# Patient Record
Sex: Female | Born: 1980 | Race: White | Hispanic: No | Smoking: Current every day smoker
Health system: Southern US, Community
[De-identification: ages and names within clinical notes are randomized; demographics above are authoritative.]

## PROBLEM LIST (undated history)

## (undated) ENCOUNTER — Ambulatory Visit: Admission: EM | Payer: 59 | Source: Home / Self Care

## (undated) DIAGNOSIS — F329 Major depressive disorder, single episode, unspecified: Secondary | ICD-10-CM

## (undated) DIAGNOSIS — F419 Anxiety disorder, unspecified: Secondary | ICD-10-CM

## (undated) DIAGNOSIS — D649 Anemia, unspecified: Secondary | ICD-10-CM

## (undated) DIAGNOSIS — E079 Disorder of thyroid, unspecified: Secondary | ICD-10-CM

## (undated) DIAGNOSIS — G43909 Migraine, unspecified, not intractable, without status migrainosus: Secondary | ICD-10-CM

## (undated) DIAGNOSIS — F32A Depression, unspecified: Secondary | ICD-10-CM

## (undated) DIAGNOSIS — J45909 Unspecified asthma, uncomplicated: Secondary | ICD-10-CM

## (undated) DIAGNOSIS — T7840XA Allergy, unspecified, initial encounter: Secondary | ICD-10-CM

## (undated) DIAGNOSIS — E119 Type 2 diabetes mellitus without complications: Secondary | ICD-10-CM

## (undated) HISTORY — PX: LAPAROSCOPY ABDOMEN DIAGNOSTIC: PRO50

## (undated) HISTORY — DX: Disorder of thyroid, unspecified: E07.9

## (undated) HISTORY — DX: Type 2 diabetes mellitus without complications: E11.9

## (undated) HISTORY — DX: Unspecified asthma, uncomplicated: J45.909

## (undated) HISTORY — DX: Anemia, unspecified: D64.9

## (undated) HISTORY — PX: TONSILLECTOMY: SUR1361

## (undated) HISTORY — DX: Anxiety disorder, unspecified: F41.9

## (undated) HISTORY — DX: Depression, unspecified: F32.A

## (undated) HISTORY — PX: ABDOMINAL HYSTERECTOMY: SHX81

## (undated) HISTORY — DX: Allergy, unspecified, initial encounter: T78.40XA

## (undated) HISTORY — DX: Major depressive disorder, single episode, unspecified: F32.9

---
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
## 2005-05-14 DIAGNOSIS — E039 Hypothyroidism, unspecified: Secondary | ICD-10-CM | POA: Insufficient documentation

## 2006-12-21 ENCOUNTER — Emergency Department (HOSPITAL_COMMUNITY): Admission: EM | Admit: 2006-12-21 | Discharge: 2006-12-21 | Payer: Self-pay | Admitting: Emergency Medicine

## 2007-05-15 DIAGNOSIS — E109 Type 1 diabetes mellitus without complications: Secondary | ICD-10-CM | POA: Insufficient documentation

## 2007-09-12 ENCOUNTER — Emergency Department (HOSPITAL_COMMUNITY): Admission: EM | Admit: 2007-09-12 | Discharge: 2007-09-12 | Payer: Self-pay | Admitting: Emergency Medicine

## 2007-09-12 IMAGING — CR DG FOOT COMPLETE 3+V*R*
3 series · 3 of 3 positions shown · non-contrast
Comparison: None

CLINICAL DATA: *Pain.

RIGHT FOOT COMPLETE - 3+ VIEW

[view not recorded (1 of 3)]
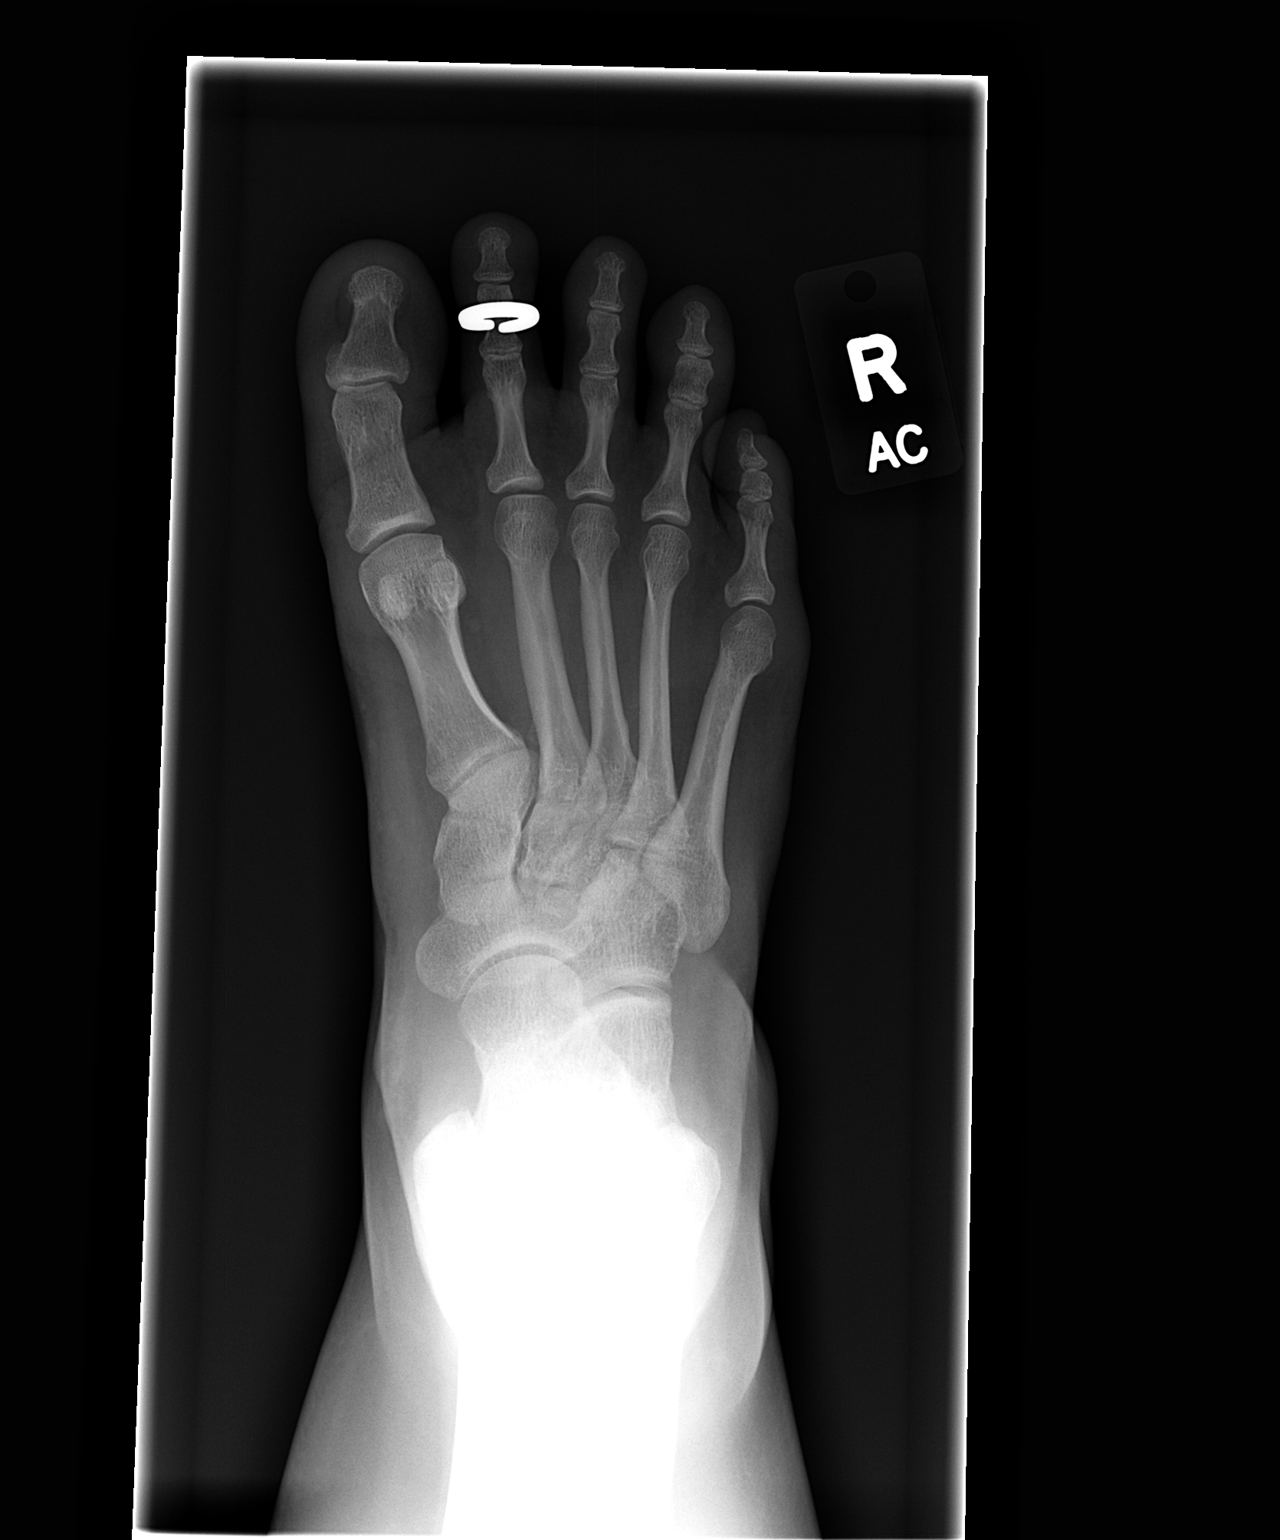

[view not recorded (2 of 3)]
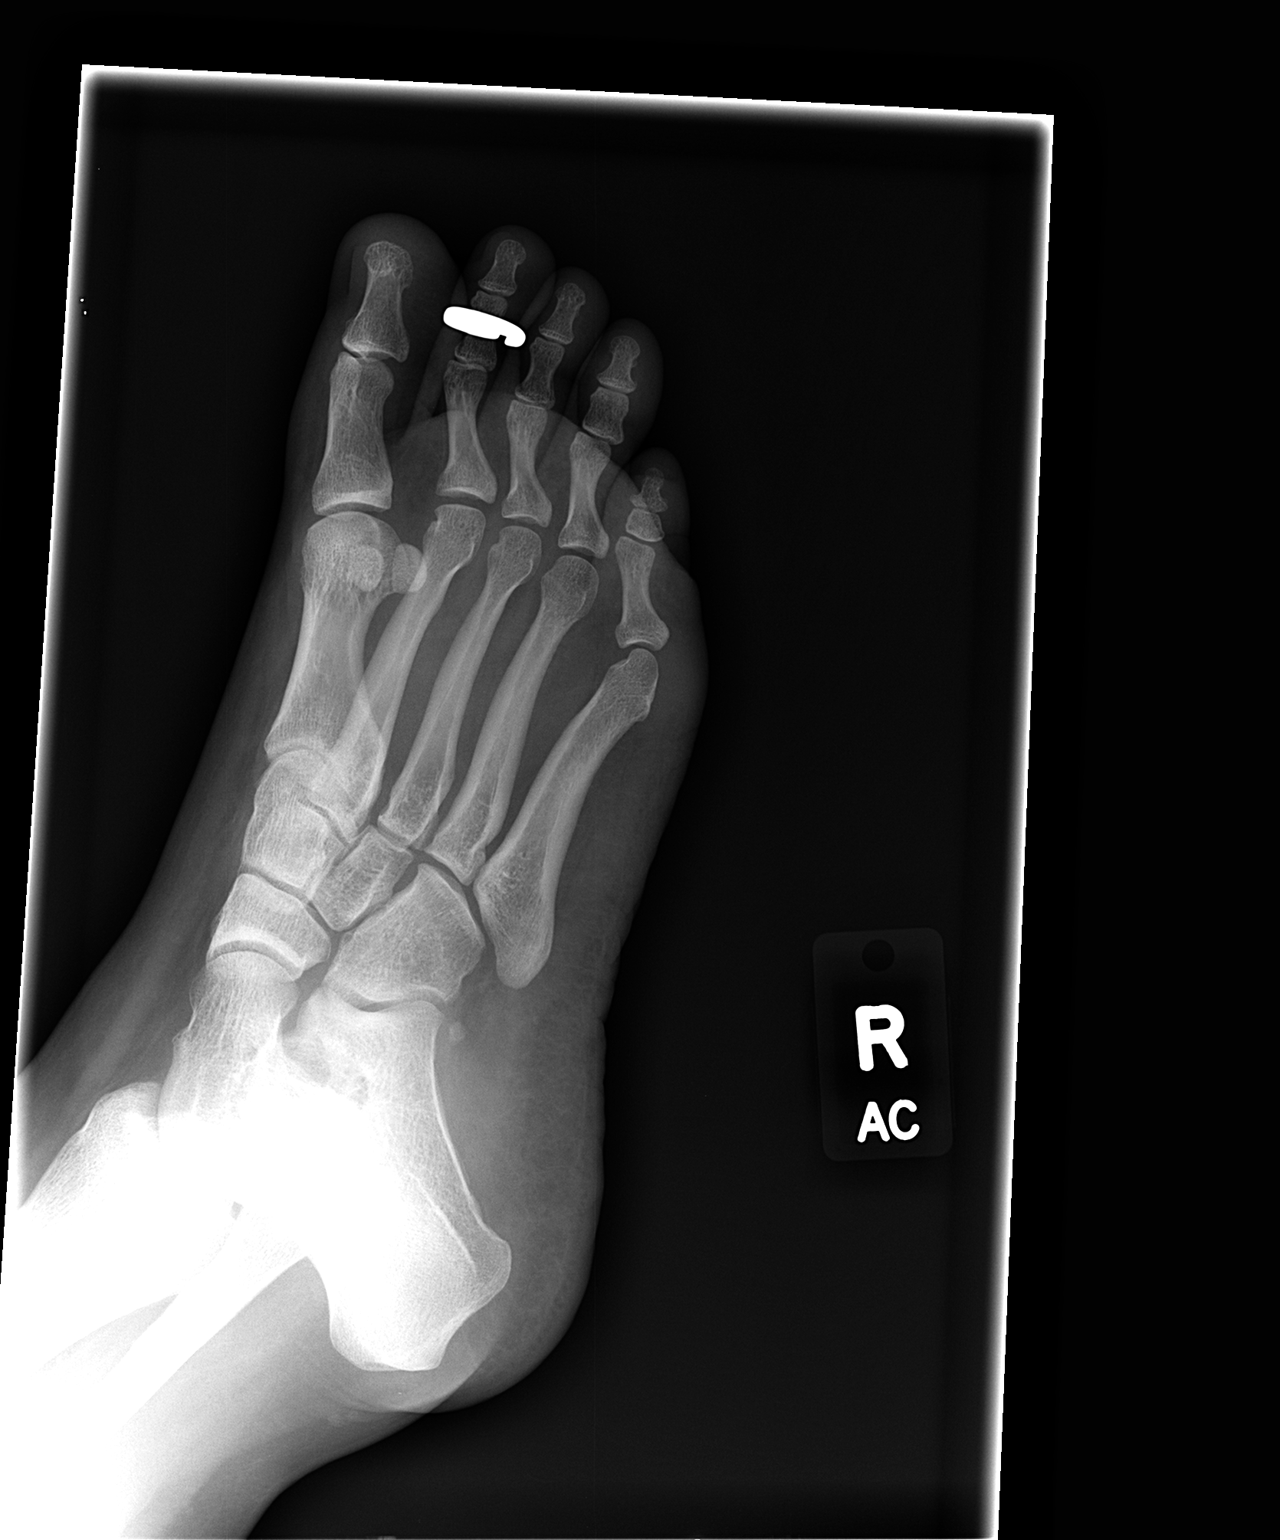

[view not recorded (3 of 3)]
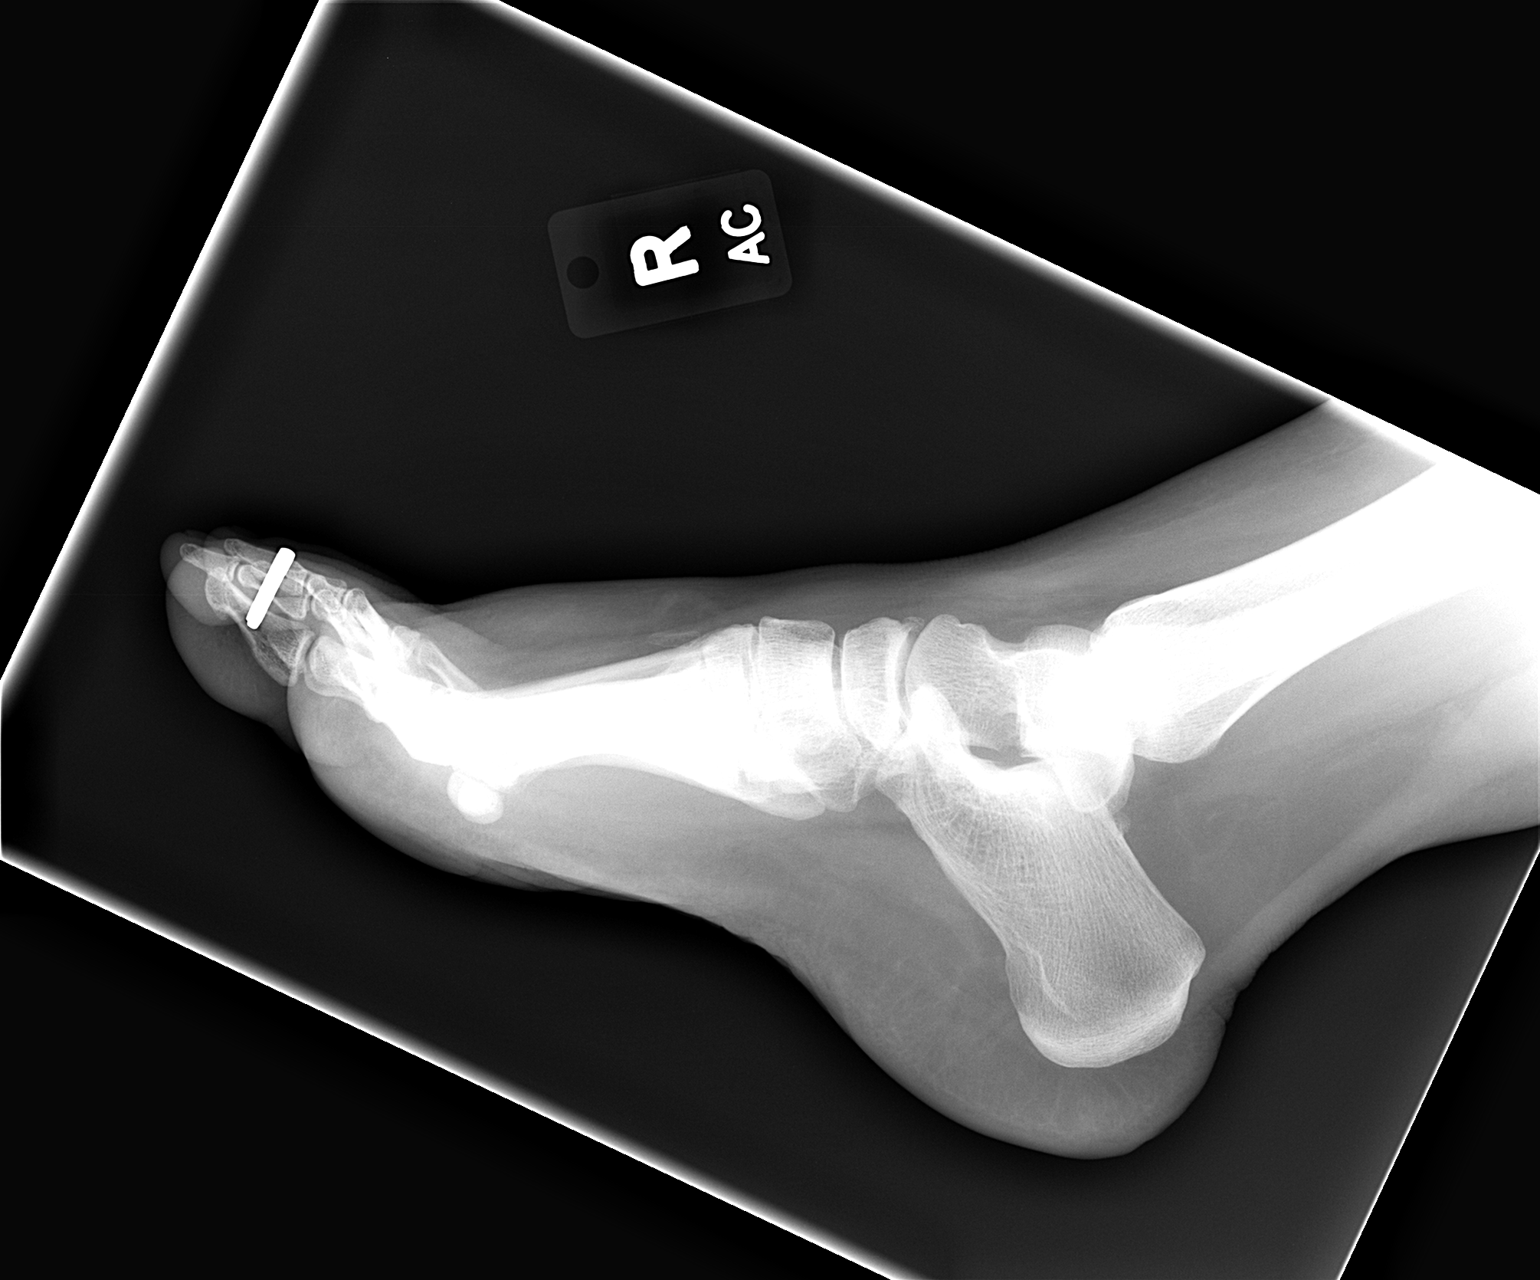

[3 of 3 positions shown; findings below may reference images not displayed]

FINDINGS: A small bony density is present adjacent to the dorsal
aspect of the navicular, seen on the lateral view only.
Nondisplaced or minimal avulsion fracture is not excluded.  Soft
tissue swelling over the dorsum of the foot is present.  Otherwise
no fractures or dislocations are seen.
IMPRESSION: Small avulsion fracture at the dorsal aspect of the navicular of
indeterminate age is suggested.

## 2007-09-15 ENCOUNTER — Emergency Department (HOSPITAL_COMMUNITY): Admission: EM | Admit: 2007-09-15 | Discharge: 2007-09-15 | Payer: Self-pay | Admitting: Emergency Medicine

## 2008-07-15 ENCOUNTER — Emergency Department (HOSPITAL_COMMUNITY): Admission: EM | Admit: 2008-07-15 | Discharge: 2008-07-15 | Payer: Self-pay | Admitting: Emergency Medicine

## 2008-09-01 ENCOUNTER — Emergency Department (HOSPITAL_COMMUNITY): Admission: EM | Admit: 2008-09-01 | Discharge: 2008-09-01 | Payer: Self-pay | Admitting: Emergency Medicine

## 2009-02-03 ENCOUNTER — Emergency Department (HOSPITAL_COMMUNITY): Admission: EM | Admit: 2009-02-03 | Discharge: 2009-02-03 | Payer: Self-pay | Admitting: Emergency Medicine

## 2010-03-20 ENCOUNTER — Emergency Department (HOSPITAL_COMMUNITY): Admission: EM | Admit: 2010-03-20 | Discharge: 2010-03-20 | Payer: Self-pay | Admitting: Emergency Medicine

## 2010-08-18 LAB — URINALYSIS, ROUTINE W REFLEX MICROSCOPIC
Glucose, UA: NEGATIVE mg/dL
Hgb urine dipstick: NEGATIVE
Specific Gravity, Urine: 1.028 (ref 1.005–1.030)
Urobilinogen, UA: 0.2 mg/dL (ref 0.0–1.0)
pH: 5 (ref 5.0–8.0)

## 2010-08-24 LAB — GLUCOSE, CAPILLARY: Glucose-Capillary: 100 mg/dL — ABNORMAL HIGH (ref 70–99)

## 2011-02-01 DIAGNOSIS — J302 Other seasonal allergic rhinitis: Secondary | ICD-10-CM | POA: Insufficient documentation

## 2011-02-01 DIAGNOSIS — F32A Depression, unspecified: Secondary | ICD-10-CM | POA: Insufficient documentation

## 2011-02-01 DIAGNOSIS — N809 Endometriosis, unspecified: Secondary | ICD-10-CM | POA: Insufficient documentation

## 2011-02-01 DIAGNOSIS — F419 Anxiety disorder, unspecified: Secondary | ICD-10-CM | POA: Insufficient documentation

## 2011-02-01 DIAGNOSIS — G4700 Insomnia, unspecified: Secondary | ICD-10-CM | POA: Insufficient documentation

## 2011-02-01 DIAGNOSIS — F411 Generalized anxiety disorder: Secondary | ICD-10-CM | POA: Insufficient documentation

## 2012-06-04 ENCOUNTER — Ambulatory Visit (INDEPENDENT_AMBULATORY_CARE_PROVIDER_SITE_OTHER): Payer: 59 | Admitting: Family Medicine

## 2012-06-04 VITALS — BP 114/81 | HR 71 | Temp 98.4°F | Resp 18 | Ht 62.0 in | Wt 192.0 lb

## 2012-06-04 DIAGNOSIS — M62838 Other muscle spasm: Secondary | ICD-10-CM

## 2012-06-04 DIAGNOSIS — S239XXA Sprain of unspecified parts of thorax, initial encounter: Secondary | ICD-10-CM | POA: Insufficient documentation

## 2012-06-04 MED ORDER — MELOXICAM 15 MG PO TABS: ORAL_TABLET | ORAL | Status: DC

## 2012-06-04 MED ORDER — TRAMADOL HCL 50 MG PO TABS: 50.0000 mg | ORAL_TABLET | Freq: Three times a day (TID) | ORAL | Status: DC | PRN

## 2012-06-04 MED ORDER — KETOROLAC TROMETHAMINE 60 MG/2ML IM SOLN
60.0000 mg | Freq: Once | INTRAMUSCULAR | Status: AC
  Administered 2012-06-04: 60 mg via INTRAMUSCULAR

## 2012-06-04 NOTE — Assessment & Plan Note (Signed)
Patient has thoracic back pain of one-day duration after fall. Patient has no pinpoint tenderness, no spinous process tenderness and no radiculopathy symptoms imaging is warranted. Patient was given an injection of 60 mg up toward all which did get some improvement after 15 minutes. In addition to that patient was given a prescription for meloxicam which she will take daily for one week and then as needed thereafter. Patient was also given tramadol to help at night. Patient was also given a home exercise program to increase range of motion. Patient is to return in one week if not better and then we will consider further imaging.

## 2012-06-04 NOTE — Patient Instructions (Signed)
Very nice to meet you. I think you do have a low back muscle spasm. I'm giving you a medicine called meloxicam. Take it daily for the next week then as needed thereafter. Only stop it if it hurts her stomach. I'm also giving you a medicine called tramadol. This medicine can make you sleepy. If for some reason over the course of the next week you do not improve please come back and we'll consider doing x-rays.

## 2012-06-04 NOTE — Progress Notes (Signed)
Chief complaint: Back pain  History of present illness: This is a 32 year old female with no significant past medical history back pain coming in with acute exacerbation after a fall one day ago. Patient states that she was walking down stairs and attempted to catch herself and she lost her balance with both arms and fell directly on her back. Patient states that her pain is mostly in the thoracic region. Patient states that it might be more right greater than left. Patient states that she did have an injury when she was 33 years old in the similar area. Patient denies any surgical procedures that were necessary though. Patient denies any radiation of pain, describes the pain as a dull constant ache, denies any numbness or weakness in the upper extremities. Patient denies any fever, shortness of breath, any weakness in the lower chewing, or any bowel or bladder incontinence.  Past Medical History  Diagnosis Date  . Allergy   . Anemia   . Depression   . Diabetes mellitus without complication   . Asthma   . Thyroid disease   . Anxiety    Past Surgical History  Procedure Date  . Abdominal hysterectomy   . Laparoscopy abdomen diagnostic   . Tonsillectomy    History  Substance Use Topics  . Smoking status: Current Every Day Smoker -- 1.0 packs/day for 20 years    Types: Cigarettes  . Smokeless tobacco: Not on file  . Alcohol Use: No   History reviewed. No pertinent family history.  Physical exam Blood pressure 114/81, pulse 71, temperature 98.4 F (36.9 C), temperature source Oral, resp. rate 18, height 5\' 2"  (1.575 m), weight 192 lb (87.091 kg), SpO2 98.00%. General: No apparent distress alert and oriented x3 moderately obese female. Cardiovascular: Regular rate and rhythm no murmur appreciated Pulmonary: Clear to auscultation bilaterally Back exam: On inspection patient does not have any gross deformity. Patient does have a back tatoo of the lower spine.  Patient has minimal tenderness  of the thoracic region mostly on the right side. Patient has no spinous process tenderness. Patient has 5 out of 5 strength of all extremities with good capillary refill in neurovascularly intact in all extremities. Patient has a negative straight leg test as well as a negative Faber's test bilaterally.Marland Kitchen

## 2012-06-05 ENCOUNTER — Telehealth: Payer: Self-pay

## 2012-06-05 MED ORDER — CYCLOBENZAPRINE HCL 10 MG PO TABS: 10.0000 mg | ORAL_TABLET | Freq: Three times a day (TID) | ORAL | Status: DC | PRN

## 2012-06-05 NOTE — Telephone Encounter (Signed)
I have sent flexeril to the pharmacy to help with muscle spasm.  I would encourage pt to use OTC medications - may alternate Tylenol and Advil every 4 hours.  Hesitant to send in another medication without seeing her as she has apparently had a reaction to mobic and ultram.  If she is in severe pain, she needs to return or go to the ED.

## 2012-06-05 NOTE — Telephone Encounter (Signed)
Patient states she is having itching from meds Tramadol, and Meloxicam given last pm I have advised her to d/c both of these meds, and I will see if we can send in something else [facility] uses [facility]

## 2012-06-05 NOTE — Telephone Encounter (Signed)
How is her pain? Could she take OTC tylenol or ibuprofen, or does she need something stronger?

## 2012-06-05 NOTE — Telephone Encounter (Signed)
She reports no relief with OTC Meds. Please advise.

## 2012-06-05 NOTE — Telephone Encounter (Signed)
Pt is calling again, she is in a lot of pain

## 2012-06-05 NOTE — Telephone Encounter (Signed)
Patient is having an allergic reaction to medication prescribed last night.  ITCHING Severely   Would like something else     CBN:  [phone]

## 2012-06-06 NOTE — Telephone Encounter (Signed)
Spoke to patient to advise. She is advised the flexeril may make her drowsy.

## 2012-06-06 NOTE — Telephone Encounter (Signed)
LMOM to CB. 

## 2012-06-10 ENCOUNTER — Ambulatory Visit (INDEPENDENT_AMBULATORY_CARE_PROVIDER_SITE_OTHER): Payer: 59 | Admitting: Family Medicine

## 2012-06-10 ENCOUNTER — Ambulatory Visit: Payer: 59

## 2012-06-10 VITALS — BP 115/79 | HR 77 | Temp 98.1°F | Resp 16 | Ht 62.5 in | Wt 196.4 lb

## 2012-06-10 DIAGNOSIS — M419 Scoliosis, unspecified: Secondary | ICD-10-CM

## 2012-06-10 DIAGNOSIS — M549 Dorsalgia, unspecified: Secondary | ICD-10-CM

## 2012-06-10 DIAGNOSIS — M62830 Muscle spasm of back: Secondary | ICD-10-CM

## 2012-06-10 DIAGNOSIS — S29019A Strain of muscle and tendon of unspecified wall of thorax, initial encounter: Secondary | ICD-10-CM

## 2012-06-10 IMAGING — CR DG THORACIC SPINE 2V
2 series · 2 of 2 positions shown · non-contrast
Comparison: None.

CLINICAL DATA: History of thoracic back pain.

THORACIC SPINE - 2 VIEW

[AP]
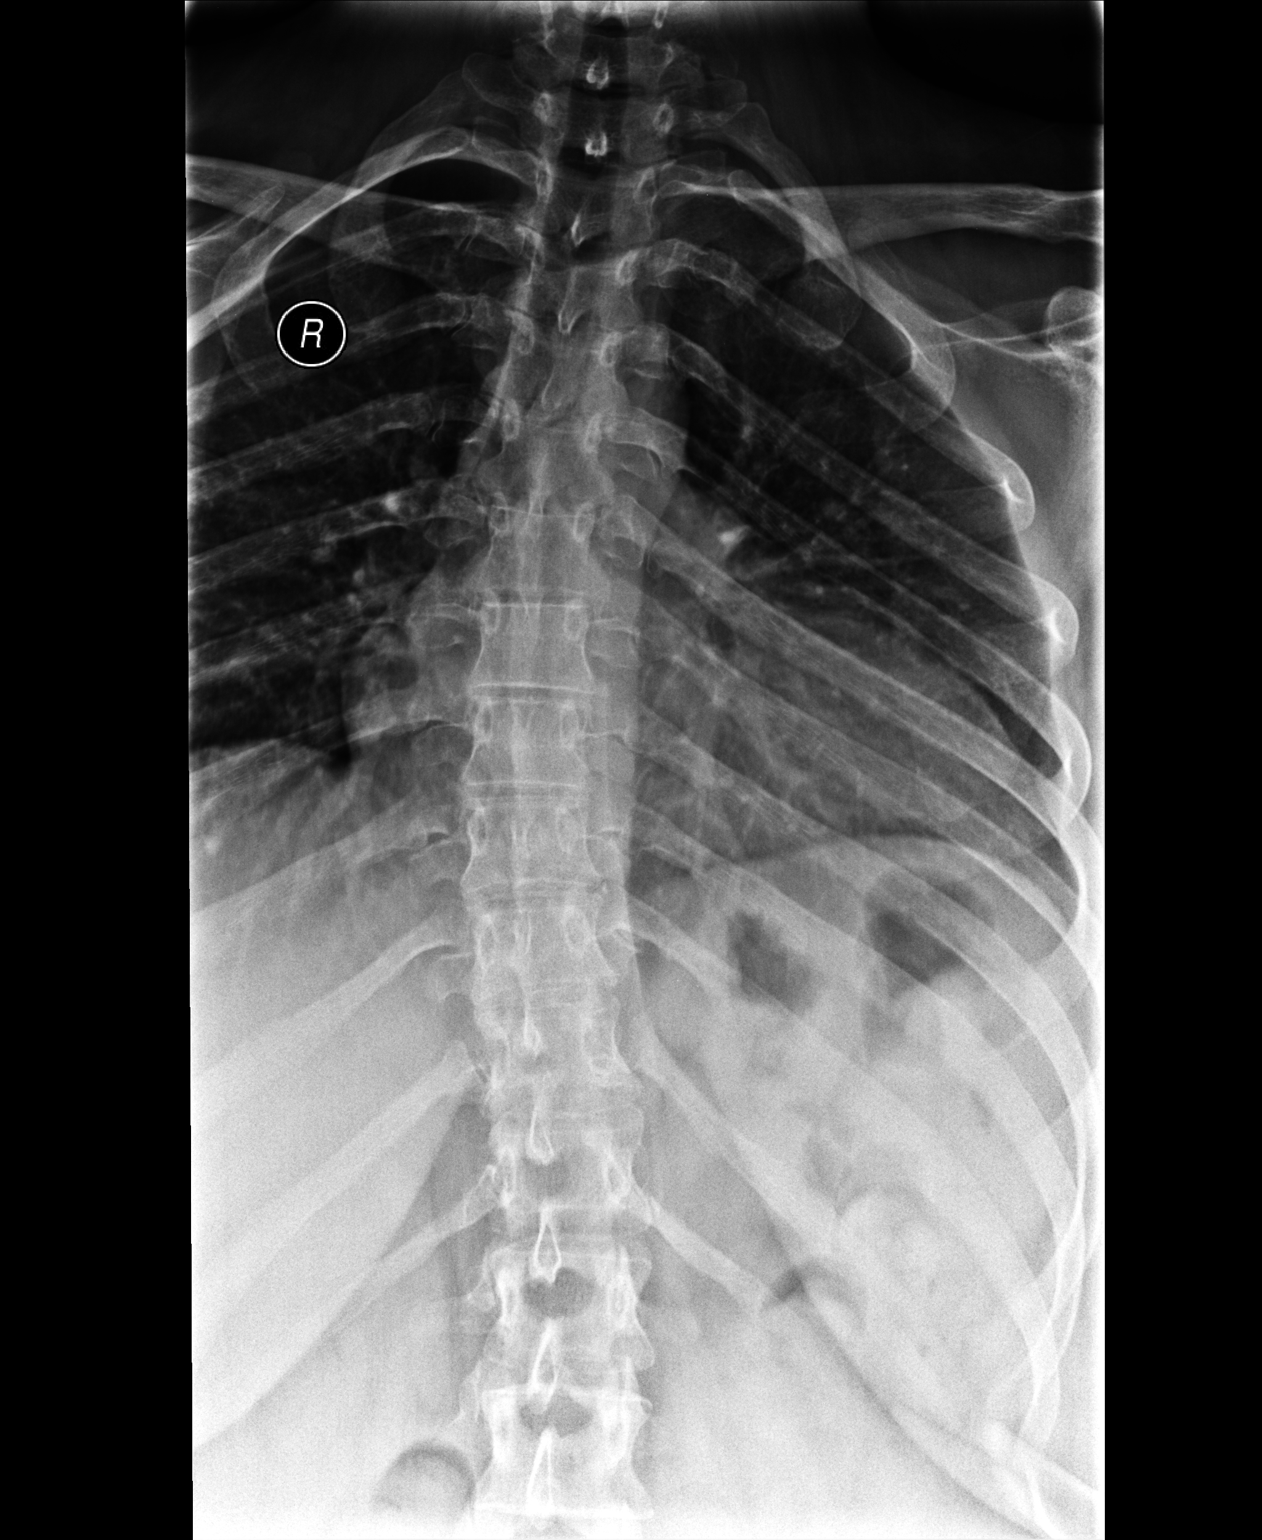

[lateral]
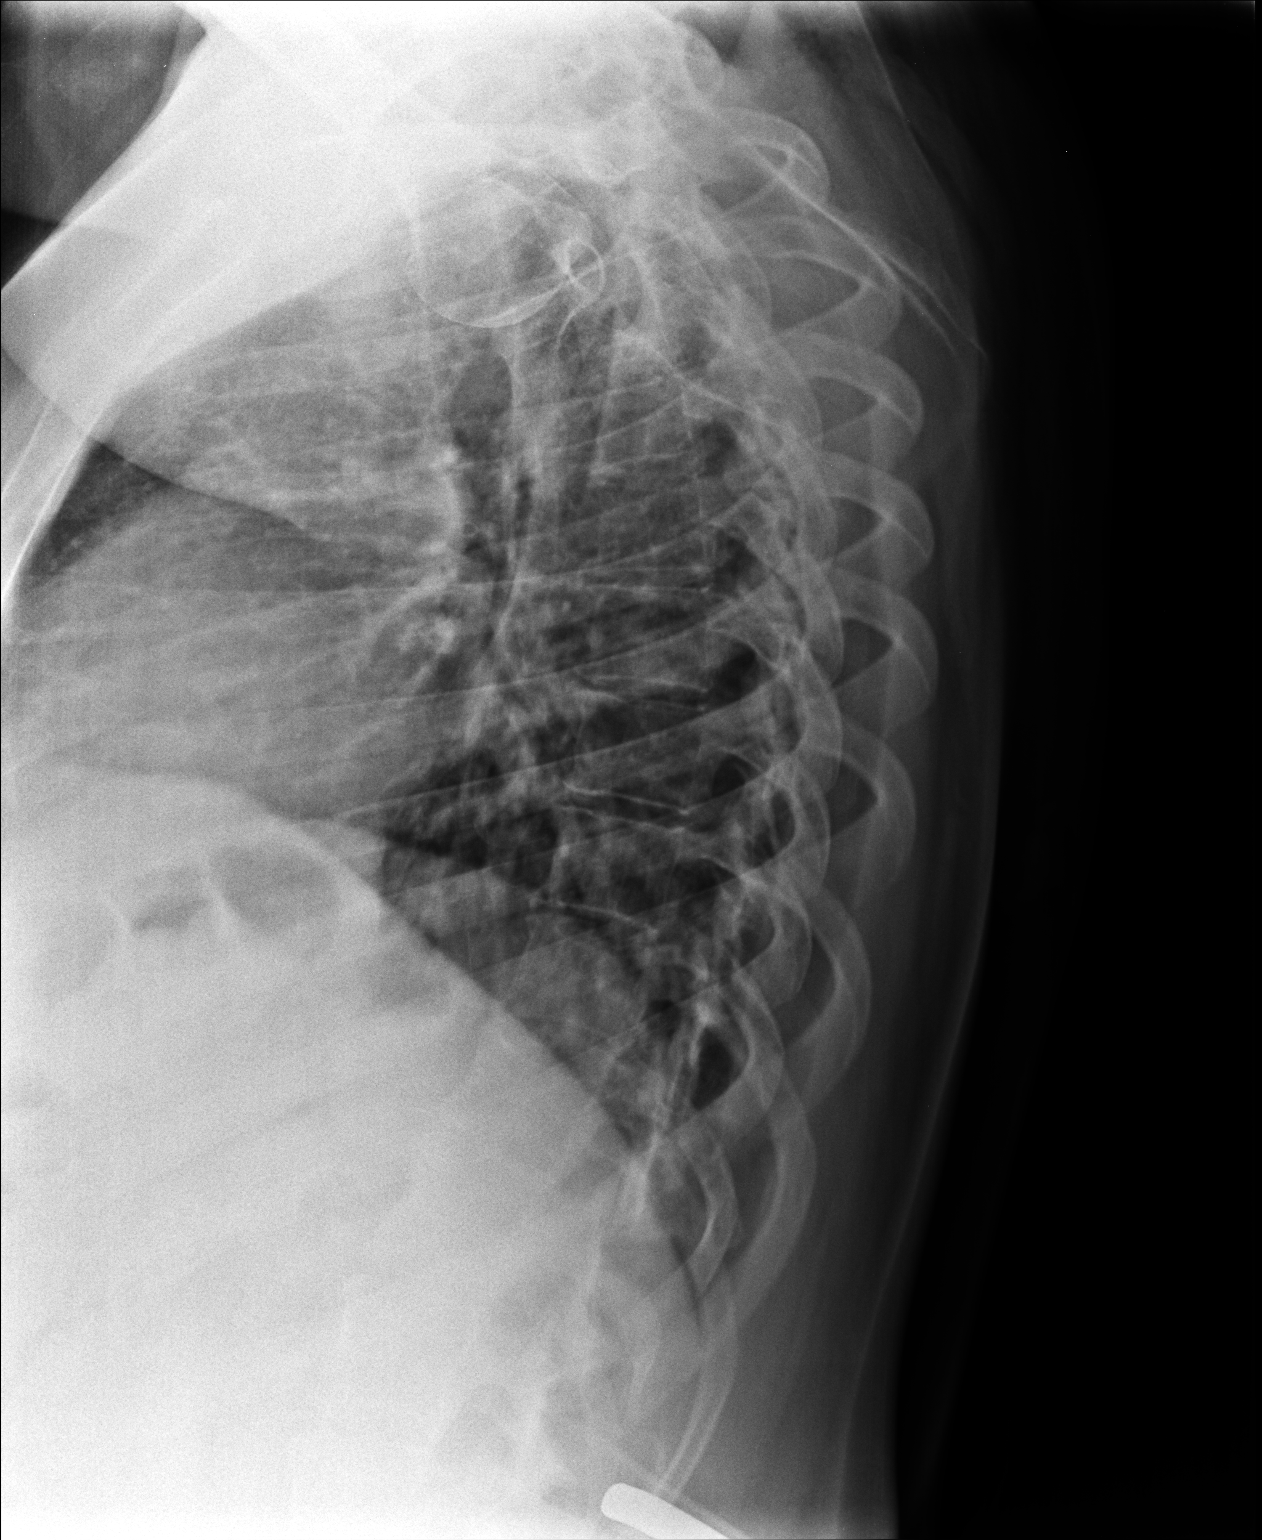

[2 of 2 positions shown; findings below may reference images not displayed]

FINDINGS: There is moderate thoracic scoliosis convexity to the
right.  No fracture or bony destruction is identified.  No
significant spondylosis is evident.
IMPRESSION: Scoliosis.

## 2012-06-10 MED ORDER — HYDROCODONE-ACETAMINOPHEN 5-325 MG PO TABS: 1.0000 | ORAL_TABLET | Freq: Every evening | ORAL | Status: DC | PRN

## 2012-06-10 NOTE — Progress Notes (Signed)
Subjective:    Patient ID: Denise Collins, female    DOB: [birth year], 32 y.o.   MRN: [id number]  HPI  Pt fell down a little a wk ago down 3 stairs and pain won't go away. 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  She has been using mobic once a day and flexeril tid but it is no longer touching it.  The flexeril doesn't help, just makes her drowsy. Hurts more when she lifts her arms but no weakness or numbness in arms and legs.  No changes in bowels or bladder. Tramadol make her itch No f/c. Past Medical History  Diagnosis Date  . Allergy   . Anemia   . Depression   . Diabetes mellitus without complication   . Asthma   . Thyroid disease   . Anxiety    Current Outpatient Prescriptions on File Prior to Visit  Medication Sig Dispense Refill  . meloxicam (MOBIC) 15 MG tablet 1 pill daily for 1 week then as needed.  30 tablet  0   Allergies  Allergen Reactions  . Imitrex (Sumatriptan)     Worsens headache  . Triptans     Worsens headache     Review of Systems  Constitutional: Negative for fever and chills.  Gastrointestinal: Negative for nausea, vomiting, abdominal pain, diarrhea and constipation.  Genitourinary: Negative for urgency, frequency, decreased urine volume and difficulty urinating.  Musculoskeletal: Positive for myalgias and back pain. Negative for joint swelling, arthralgias and gait problem.  Neurological: Negative for weakness and numbness.  Hematological: Negative for adenopathy. Does not bruise/bleed easily.  Psychiatric/Behavioral: Positive for sleep disturbance.      BP 115/79  Pulse 77  Temp 98.1 F (36.7 C) (Oral)  Resp 16  Ht 5' 2.5" (1.588 m)  Wt 196 lb 6.4 oz (89.086 kg)  BMI 35.35 kg/m2 Objective:   Physical Exam  Constitutional: She is oriented to person, place, and time. She appears well-developed and well-nourished.  HENT:  Head: Normocephalic.  Eyes: Conjunctivae  normal are normal. No scleral icterus.  Neck: Normal range of motion. Neck supple.  Cardiovascular: Normal rate, regular rhythm and normal heart sounds.   Pulmonary/Chest: Effort normal and breath sounds normal. No respiratory distress.  Musculoskeletal: She exhibits no edema.       Right hip: Normal.       Left hip: Normal.       Cervical back: Normal.       Thoracic back: She exhibits decreased range of motion, tenderness, bony tenderness, pain and spasm. She exhibits no edema, no deformity and no laceration.       Lumbar back: She exhibits decreased range of motion and spasm. She exhibits no tenderness, no bony tenderness and no deformity.  Neurological: She is alert and oriented to person, place, and time. She has normal strength and normal reflexes. No sensory deficit. She exhibits normal muscle tone. Coordination and gait normal.  Reflex Scores:      Patellar reflexes are 2+ on the right side and 2+ on the left side.      Achilles reflexes are 2+ on the right side and 2+ on the left side. Skin: Skin is warm and dry. No erythema.  Psychiatric: She has a normal mood and affect. Her behavior is normal.      [facility] reading (PRIMARY) by  Dr. Clelia Croft. Mild thoracic scoliosis. No acute abnormality.  Assessment &  Plan:   1. Back pain  DG Thoracic Spine 2 View  2. Scoliosis of thoracic spine  Ambulatory referral to Physical Therapy  3. Muscle spasm of back  Ambulatory referral to Physical Therapy  4. Strain of thoracic region     Meds ordered this encounter  Medications  . HYDROcodone-acetaminophen (NORCO/VICODIN) 5-325 MG per tablet    Sig: Take 1 tablet by mouth at bedtime as needed for pain.    Dispense:  12 tablet    Refill:  0   D/c flexeril and tramadol. Cont mobic. Freq heat qid with gentle stretching.

## 2012-06-10 NOTE — Patient Instructions (Addendum)
Scoliosis Scoliosis is the name given to a spine that curves sideways. It is a common condition found in up to ten percent of adolescents. It is more common in teenage girls. This is sometimes the result of other underlying problems such as unequal leg length or muscular problems. Approximately 70% of the time the cause unknown. It can cause twisting of the shoulders, hips, chest, back, and rib cage. Exercises generally do not affect the course of this disease, but may be helpful in strengthening weak muscle groups. Orthopedic braces may be needed during growth spurts. Surgery may be necessary for progressive cases. HOME CARE INSTRUCTIONS   Your caregiver may suggest exercises to strengthen your muscles. Follow their instructions. Ask your caregiver if you can participate in sports activities.  Bracing may be needed to try to limit the progression of the spinal curve. Wear the brace as instructed by your caregiver.  Follow-up appointments are important. Often mild cases of scoliosis can be kept track of by regular physical exams. However, periodic x-rays may be taken in more severe cases to follow the progress of the curvature, especially with brace treatment. Scoliosis can be corrected or improved if treated early. SEEK IMMEDIATE MEDICAL CARE IF:  You have back pain that is not relieved by medications prescribed by your caregiver.  If there is weakness or increased muscle tone (spasticity) in your legs or any loss of bowel or bladder control. Document Released: 04/27/2000 Document Revised: 07/23/2011 Document Reviewed: 05/17/2008 [facility] Patient Information 2013 [place], [place].  Thoracic Strain You have injured the muscles or tendons that attach to the upper part of your back behind your chest. This injury is called a thoracic strain, thoracic sprain, or mid-back strain.  CAUSES  The cause of thoracic strain varies. A less severe injury involves pulling a muscle or tendon without tearing it. A  more severe injury involves tearing (rupturing) a muscle or tendon. With less severe injuries, there may be little loss of strength. Sometimes, there are breaks (fractures) in the bones to which the muscles are attached. These fractures are rare, unless there was a direct hit (trauma) or you have weak bones due to osteoporosis or age. Longstanding strains may be caused by overuse or improper form during certain movements. Obesity can also increase your risk for back injuries. Sudden strains may occur due to injury or not warming up properly before exercise. Often, there is no obvious cause for a thoracic strain. SYMPTOMS  The main symptom is pain, especially with movement, such as during exercise. DIAGNOSIS  Your caregiver can usually tell what is wrong by taking an X-ray and doing a physical exam. TREATMENT   Physical therapy may be helpful for recovery. Your caregiver can give you exercises to do or refer you to a physical therapist after your pain improves.  After your pain improves, strengthening and conditioning programs appropriate for your sport or occupation may be helpful.  Always warm up before physical activities or athletics. Stretching after physical activity may also help.  Certain over-the-counter medicines may also help. Ask your caregiver if there are medicines that would help you. If this is your first thoracic strain injury, proper care and proper healing time before starting activities should prevent long-term problems. Torn ligaments and tendons require as long to heal as broken bones. Average healing times may be only 1 week for a mild strain. For torn muscles and tendons, healing time may be up to 6 weeks to 2 months. HOME CARE INSTRUCTIONS   Apply  ice to the injured area. Ice massages may also be used as directed.  Put ice in a plastic bag.  Place a towel between your skin and the bag.  Leave the ice on for 15 to 20 minutes, 3 to 4 times a day, for the first 2  days.  Only take over-the-counter or prescription medicines for pain, discomfort, or fever as directed by your caregiver.  Keep your appointments for physical therapy if this was prescribed.  Use wraps and back braces as instructed. SEEK IMMEDIATE MEDICAL CARE IF:   You have an increase in bruising, swelling, or pain.  Your pain has not improved with medicines.  You develop new shortness of breath, chest pain, or fever.  Problems seem to be getting worse rather than better. MAKE SURE YOU:   Understand these instructions.  Will watch your condition.  Will get help right away if you are not doing well or get worse. Document Released: 07/21/2003 Document Revised: 07/23/2011 Document Reviewed: 06/16/2010 [facility] Patient Information 2013 [place], [place].

## 2012-06-12 NOTE — Progress Notes (Signed)
Below note reviewed in detail. Agree with assessment and plan.  KMS

## 2012-06-12 NOTE — Addendum Note (Signed)
Addended by: Ethelda Chick on: 06/12/2012 01:23 PM   Modules accepted: Level of Service

## 2012-06-13 NOTE — Progress Notes (Signed)
Reviewed and agree.

## 2012-06-16 ENCOUNTER — Telehealth: Payer: Self-pay

## 2012-06-16 NOTE — Telephone Encounter (Signed)
PT STATES SHE HAD REQUESTED SOME ZOLOFT 250MG S, DIDN'T SAY ANYTHING TO THE DR, BUT TOLD THE NURSE AND HASN'T HEARD FROM ANYONE. PLEASE CALL [phone]    [facility] [address]

## 2012-06-16 NOTE — Telephone Encounter (Signed)
We have not seen her for this.

## 2012-06-16 NOTE — Telephone Encounter (Signed)
Was pt getting this here? It is not in epic at all.  If she plans to continue getting this med here, ok to give her a 67mo rx for zoloft 200mg  qd but then needs to sched a visit to review chronic medical problems before any other fills.

## 2012-06-16 NOTE — Telephone Encounter (Signed)
Patient states she has been on Zoloft 200mg , ran out 2 weeks. She wanted to discuss with you at office visit, but forgot. Please advise if okay to send in or if patient needs to come back to discuss.

## 2012-06-17 MED ORDER — SERTRALINE HCL 100 MG PO TABS: 200.0000 mg | ORAL_TABLET | Freq: Every day | ORAL | Status: DC

## 2012-06-17 NOTE — Telephone Encounter (Signed)
Patient advised of need for visit before this 34mo supply runs out/ she agrees to plan

## 2012-07-09 ENCOUNTER — Emergency Department (HOSPITAL_BASED_OUTPATIENT_CLINIC_OR_DEPARTMENT_OTHER)
Admission: EM | Admit: 2012-07-09 | Discharge: 2012-07-09 | Disposition: A | Discharge status: Home / Self Care | Payer: 59 | Attending: Emergency Medicine | Admitting: Emergency Medicine

## 2012-07-09 ENCOUNTER — Telehealth: Payer: Self-pay | Admitting: Radiology

## 2012-07-09 ENCOUNTER — Encounter (HOSPITAL_BASED_OUTPATIENT_CLINIC_OR_DEPARTMENT_OTHER): Payer: Self-pay | Admitting: Family Medicine

## 2012-07-09 DIAGNOSIS — J45909 Unspecified asthma, uncomplicated: Secondary | ICD-10-CM | POA: Insufficient documentation

## 2012-07-09 DIAGNOSIS — F3289 Other specified depressive episodes: Secondary | ICD-10-CM | POA: Insufficient documentation

## 2012-07-09 DIAGNOSIS — Z79899 Other long term (current) drug therapy: Secondary | ICD-10-CM | POA: Insufficient documentation

## 2012-07-09 DIAGNOSIS — Z90710 Acquired absence of both cervix and uterus: Secondary | ICD-10-CM | POA: Insufficient documentation

## 2012-07-09 DIAGNOSIS — G43909 Migraine, unspecified, not intractable, without status migrainosus: Secondary | ICD-10-CM

## 2012-07-09 DIAGNOSIS — R112 Nausea with vomiting, unspecified: Secondary | ICD-10-CM | POA: Insufficient documentation

## 2012-07-09 DIAGNOSIS — E079 Disorder of thyroid, unspecified: Secondary | ICD-10-CM | POA: Insufficient documentation

## 2012-07-09 DIAGNOSIS — F17200 Nicotine dependence, unspecified, uncomplicated: Secondary | ICD-10-CM | POA: Insufficient documentation

## 2012-07-09 DIAGNOSIS — Z862 Personal history of diseases of the blood and blood-forming organs and certain disorders involving the immune mechanism: Secondary | ICD-10-CM | POA: Insufficient documentation

## 2012-07-09 DIAGNOSIS — E119 Type 2 diabetes mellitus without complications: Secondary | ICD-10-CM | POA: Insufficient documentation

## 2012-07-09 DIAGNOSIS — F411 Generalized anxiety disorder: Secondary | ICD-10-CM | POA: Insufficient documentation

## 2012-07-09 MED ORDER — METOCLOPRAMIDE HCL 5 MG/ML IJ SOLN
10.0000 mg | Freq: Once | INTRAMUSCULAR | Status: AC
  Administered 2012-07-09: 10 mg via INTRAVENOUS
  Filled 2012-07-09: qty 2

## 2012-07-09 MED ORDER — PROMETHAZINE HCL 25 MG/ML IJ SOLN
12.5000 mg | Freq: Once | INTRAMUSCULAR | Status: AC
  Administered 2012-07-09: 15:00:00 via INTRAVENOUS
  Filled 2012-07-09: qty 1

## 2012-07-09 MED ORDER — SODIUM CHLORIDE 0.9 % IV BOLUS (SEPSIS): 1000.0000 mL | Freq: Once | INTRAVENOUS | Status: DC

## 2012-07-09 MED ORDER — DIPHENHYDRAMINE HCL 50 MG/ML IJ SOLN
25.0000 mg | Freq: Once | INTRAMUSCULAR | Status: AC
  Administered 2012-07-09: 25 mg via INTRAVENOUS
  Filled 2012-07-09: qty 1

## 2012-07-09 MED ORDER — DIPHENHYDRAMINE HCL 50 MG/ML IJ SOLN
25.0000 mg | Freq: Once | INTRAMUSCULAR | Status: AC
  Administered 2012-07-09: 14:00:00 via INTRAVENOUS
  Filled 2012-07-09: qty 1

## 2012-07-09 MED ORDER — KETOROLAC TROMETHAMINE 30 MG/ML IJ SOLN
30.0000 mg | Freq: Once | INTRAMUSCULAR | Status: AC
  Administered 2012-07-09: 30 mg via INTRAVENOUS
  Filled 2012-07-09: qty 1

## 2012-07-09 NOTE — ED Notes (Signed)
Pt c/o migraine since Friday intermittently and sts normal meds are not working. Pt c/o nausea and dizziness.

## 2012-07-09 NOTE — Telephone Encounter (Signed)
Please advise on Abilify refill request faxed from [facility] , they are requesting 90 day supply on 15mg  Abilify. Also request for Sertraline 100 mg

## 2012-07-09 NOTE — ED Provider Notes (Signed)
History     [name]: [id number]  Arrival date & time 07/09/12  1331   First MD Initiated Contact with Patient 07/09/12 1349      Chief Complaint  Patient presents with  . Migraine    (Consider location/radiation/quality/duration/timing/severity/associated sxs/prior treatment) HPI Comments: Pt has a history of migraine and this is similar just hasn't gone away  Patient is a 32 y.o. female presenting with migraines. The history is provided by the patient. No language interpreter was used.  Migraine This is a recurrent problem. The current episode started in the past 7 days. The problem occurs constantly. The problem has been unchanged. Associated symptoms include nausea and vomiting. Pertinent negatives include no chest pain, coughing or fever. Nothing aggravates the symptoms. Treatments tried: fioricet, tizanidine.    Past Medical History  Diagnosis Date  . Allergy   . Anemia   . Depression   . Diabetes mellitus without complication   . Asthma   . Thyroid disease   . Anxiety     Past Surgical History  Procedure Laterality Date  . Abdominal hysterectomy    . Laparoscopy abdomen diagnostic    . Tonsillectomy      No family history on file.  History  Substance Use Topics  . Smoking status: Current Every Day Smoker -- 1.00 packs/day for 20 years    Types: Cigarettes  . Smokeless tobacco: Not on file  . Alcohol Use: Yes    OB History   Grav Para Term Preterm Abortions TAB SAB Ect Mult Living                  Review of Systems  Constitutional: Negative for fever.  Respiratory: Negative for cough.   Cardiovascular: Negative for chest pain.  Gastrointestinal: Positive for nausea and vomiting.    Allergies  Imitrex and Triptans  Home Medications   Current Outpatient Rx  Name  Route  Sig  Dispense  Refill  . ALPRAZolam (XANAX PO)   Oral   Take by mouth.         . ARIPiprazole (ABILIFY PO)   Oral   Take by mouth.         Marland Kitchen Butalbital-APAP-Caffeine  (FIORICET PO)   Oral   Take by mouth.         Marland Kitchen LEVOTHYROXINE SODIUM PO   Oral   Take by mouth.         . Rosuvastatin Calcium (CRESTOR PO)   Oral   Take by mouth.         Marland Kitchen TIZANIDINE HCL PO   Oral   Take by mouth.         Marland Kitchen UNABLE TO FIND      Insulin pump-left arm         . HYDROcodone-acetaminophen (NORCO/VICODIN) 5-325 MG per tablet   Oral   Take 1 tablet by mouth at bedtime as needed for pain.   12 tablet   0   . meloxicam (MOBIC) 15 MG tablet      1 pill daily for 1 week then as needed.   30 tablet   0   . sertraline (ZOLOFT) 100 MG tablet   Oral   Take 2 tablets (200 mg total) by mouth daily.   60 tablet   5     BP 126/90  Pulse 53  Temp(Src) 98.5 F (36.9 C) (Oral)  Resp 18  Ht 5' (1.524 m)  Wt 192 lb (87.091 kg)  BMI 37.5 kg/m2  SpO2  99%  Physical Exam  Nursing note and vitals reviewed. Constitutional: She is oriented to person, place, and time. She appears well-developed and well-nourished.  HENT:  Head: Normocephalic and atraumatic.  Eyes: Conjunctivae and EOM are normal.  Neck: Neck supple.  Cardiovascular: Normal rate and regular rhythm.   Pulmonary/Chest: Effort normal and breath sounds normal.  Abdominal: Soft. Bowel sounds are normal. There is no tenderness.  Musculoskeletal: Normal range of motion.  Neurological: She is alert and oriented to person, place, and time.  Skin: Skin is warm and dry.  Psychiatric: She has a normal mood and affect.    ED Course  Procedures (including critical care time)  Labs Reviewed - No data to display No results found.   1. Migraine       MDM  Pt is feeling a lot better although symptoms not completely gone:will send home and pt can follow up with pcp        Teressa Lower, NP 07/09/12 1608

## 2012-07-09 NOTE — Telephone Encounter (Signed)
noted 

## 2012-07-09 NOTE — Telephone Encounter (Signed)
We are not the rxing practice for the pt's chronic psych meds - per my last OV note, she has a PCP elsewhere. If she wants to transfer her care here, she needs to be seen.

## 2012-07-10 ENCOUNTER — Encounter (HOSPITAL_BASED_OUTPATIENT_CLINIC_OR_DEPARTMENT_OTHER): Payer: Self-pay | Admitting: Emergency Medicine

## 2012-07-10 ENCOUNTER — Emergency Department (HOSPITAL_BASED_OUTPATIENT_CLINIC_OR_DEPARTMENT_OTHER)
Admission: EM | Admit: 2012-07-10 | Discharge: 2012-07-10 | Disposition: A | Discharge status: Home / Self Care | Payer: 59 | Attending: Emergency Medicine | Admitting: Emergency Medicine

## 2012-07-10 DIAGNOSIS — R42 Dizziness and giddiness: Secondary | ICD-10-CM | POA: Insufficient documentation

## 2012-07-10 DIAGNOSIS — E079 Disorder of thyroid, unspecified: Secondary | ICD-10-CM | POA: Insufficient documentation

## 2012-07-10 DIAGNOSIS — F17200 Nicotine dependence, unspecified, uncomplicated: Secondary | ICD-10-CM | POA: Insufficient documentation

## 2012-07-10 DIAGNOSIS — E162 Hypoglycemia, unspecified: Secondary | ICD-10-CM

## 2012-07-10 DIAGNOSIS — Z862 Personal history of diseases of the blood and blood-forming organs and certain disorders involving the immune mechanism: Secondary | ICD-10-CM | POA: Insufficient documentation

## 2012-07-10 DIAGNOSIS — G43909 Migraine, unspecified, not intractable, without status migrainosus: Secondary | ICD-10-CM

## 2012-07-10 DIAGNOSIS — Z79899 Other long term (current) drug therapy: Secondary | ICD-10-CM | POA: Insufficient documentation

## 2012-07-10 DIAGNOSIS — Z794 Long term (current) use of insulin: Secondary | ICD-10-CM | POA: Insufficient documentation

## 2012-07-10 DIAGNOSIS — F3289 Other specified depressive episodes: Secondary | ICD-10-CM | POA: Insufficient documentation

## 2012-07-10 DIAGNOSIS — F411 Generalized anxiety disorder: Secondary | ICD-10-CM | POA: Insufficient documentation

## 2012-07-10 DIAGNOSIS — J45909 Unspecified asthma, uncomplicated: Secondary | ICD-10-CM | POA: Insufficient documentation

## 2012-07-10 DIAGNOSIS — E1169 Type 2 diabetes mellitus with other specified complication: Secondary | ICD-10-CM | POA: Insufficient documentation

## 2012-07-10 HISTORY — DX: Migraine, unspecified, not intractable, without status migrainosus: G43.909

## 2012-07-10 LAB — CBC WITH DIFFERENTIAL/PLATELET
Basophils Absolute: 0 10*3/uL (ref 0.0–0.1)
HCT: 36.8 % (ref 36.0–46.0)
Hemoglobin: 12.5 g/dL (ref 12.0–15.0)
Lymphocytes Relative: 19 % (ref 12–46)
Lymphs Abs: 2.1 10*3/uL (ref 0.7–4.0)
MCV: 91.3 fL (ref 78.0–100.0)
Monocytes Absolute: 1.1 10*3/uL — ABNORMAL HIGH (ref 0.1–1.0)
Neutro Abs: 7.7 10*3/uL (ref 1.7–7.7)
RBC: 4.03 MIL/uL (ref 3.87–5.11)
RDW: 12.5 % (ref 11.5–15.5)
WBC: 11.2 10*3/uL — ABNORMAL HIGH (ref 4.0–10.5)

## 2012-07-10 LAB — BASIC METABOLIC PANEL
CO2: 24 mEq/L (ref 19–32)
Chloride: 102 mEq/L (ref 96–112)
Creatinine, Ser: 0.6 mg/dL (ref 0.50–1.10)
Glucose, Bld: 243 mg/dL — ABNORMAL HIGH (ref 70–99)

## 2012-07-10 LAB — GLUCOSE, CAPILLARY: Glucose-Capillary: 243 mg/dL — ABNORMAL HIGH (ref 70–99)

## 2012-07-10 MED ORDER — DIPHENHYDRAMINE HCL 50 MG/ML IJ SOLN
25.0000 mg | Freq: Once | INTRAMUSCULAR | Status: AC
  Administered 2012-07-10: 25 mg via INTRAVENOUS
  Filled 2012-07-10: qty 1

## 2012-07-10 MED ORDER — SODIUM CHLORIDE 0.9 % IV BOLUS (SEPSIS)
1000.0000 mL | Freq: Once | INTRAVENOUS | Status: AC
  Administered 2012-07-10: 1000 mL via INTRAVENOUS

## 2012-07-10 MED ORDER — KETOROLAC TROMETHAMINE 30 MG/ML IJ SOLN
30.0000 mg | Freq: Once | INTRAMUSCULAR | Status: AC
  Administered 2012-07-10: 30 mg via INTRAVENOUS
  Filled 2012-07-10: qty 1

## 2012-07-10 MED ORDER — METOCLOPRAMIDE HCL 5 MG/ML IJ SOLN
10.0000 mg | Freq: Once | INTRAMUSCULAR | Status: AC
  Administered 2012-07-10: 10 mg via INTRAVENOUS
  Filled 2012-07-10: qty 2

## 2012-07-10 NOTE — ED Provider Notes (Signed)
History     [name]: [id number]  Arrival date & time 07/10/12  1627   First MD Initiated Contact with Patient 07/10/12 1631      Chief Complaint  Patient presents with  . 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  Presents here with headaches for the past week.  Was seen last night for the same and given headache meds and felt better.  She returns today after feeling dizzy at work and sugar check was 38.  She turned off her insulin pump and sugars now better.  EMS called by coworkers.  Her headache has now returned.  Patient is a 32 y.o. female presenting with migraines. The history is provided by the patient.  Migraine This is a recurrent problem. The current episode started yesterday. The problem occurs constantly. The problem has been gradually worsening. Nothing aggravates the symptoms. Nothing relieves the symptoms. Treatments tried: meds in ED. The treatment provided significant relief.    Past Medical History  Diagnosis Date  . Allergy   . Anemia   . Depression   . Diabetes mellitus without complication   . Asthma   . Thyroid disease   . Anxiety     Past Surgical History  Procedure Laterality Date  . Abdominal hysterectomy    . Laparoscopy abdomen diagnostic    . Tonsillectomy      No family history on file.  History  Substance Use Topics  . Smoking status: Current Every Day Smoker -- 1.00 packs/day for 20 years    Types: Cigarettes  . Smokeless tobacco: Not on file  . Alcohol Use: Yes    OB History   Grav Para Term Preterm Abortions TAB SAB Ect Mult Living                  Review of Systems  All other systems reviewed and are negative.    Allergies  Imitrex and Triptans  Home Medications   Current Outpatient Rx  Name  Route  Sig  Dispense  Refill  . ALPRAZolam (XANAX PO)   Oral   Take by mouth.          . ARIPiprazole (ABILIFY PO)   Oral   Take by mouth.         Marland Kitchen Butalbital-APAP-Caffeine (FIORICET PO)   Oral   Take by mouth.         Marland Kitchen HYDROcodone-acetaminophen (NORCO/VICODIN) 5-325 MG per tablet   Oral   Take 1 tablet by mouth at bedtime as needed for pain.   12 tablet   0   . LEVOTHYROXINE SODIUM PO   Oral   Take by mouth.         . meloxicam (MOBIC) 15 MG tablet      1 pill daily for 1 week then as needed.   30 tablet   0   . Rosuvastatin Calcium (CRESTOR PO)   Oral   Take by mouth.         . sertraline (ZOLOFT) 100 MG tablet   Oral   Take 2 tablets (200 mg total) by mouth daily.   60 tablet   5   . TIZANIDINE HCL PO   Oral   Take by mouth.         Marland Kitchen UNABLE TO FIND      Insulin pump-left arm           BP 120/75  Pulse 58  Temp(Src) 97.7 F (36.5 C) (Oral)  Resp 16  SpO2 100%  Physical Exam  Nursing note and vitals reviewed. Constitutional: She is oriented to person, place, and time. She appears well-developed and well-nourished. No distress.  HENT:  Head: Normocephalic and atraumatic.  Eyes: EOM are normal. Pupils are equal, round, and reactive to light.  No papilledema.  Neck: Normal range of motion. Neck supple.  Cardiovascular: Normal rate and regular rhythm.  Exam reveals no gallop and no friction rub.   No murmur heard. Pulmonary/Chest: Effort normal and breath sounds normal. No respiratory distress. She has no wheezes.  Abdominal: Soft. Bowel sounds are normal. She exhibits no distension. There is no tenderness.  Musculoskeletal: Normal range of motion.  Neurological: She is alert and oriented to person, place, and time. No cranial nerve deficit. She exhibits normal muscle tone. Coordination normal.  Skin: Skin is warm and dry. She is not diaphoretic.    ED Course  Procedures (including critical care time)  Labs Reviewed  CBC WITH DIFFERENTIAL  BASIC METABOLIC PANEL   No results found.   No diagnosis  found.    MDM  Sugars has normalized and she is feeling better after meds given.  No concerning exam findings or indication for ct or lp.  Will discharge to home, return prn.          Geoffery Lyons, MD 07/10/12 [phone]

## 2012-07-10 NOTE — ED Notes (Signed)
Here yesterday for same, treated and released feeling better.  States headache returned last pm.  Today while at work felt  Dizzy,checked her cbg,it was 69.  Turned off insulin pump cbg increased to 150.  Still has headache.  Alert and oriented  ambulatory

## 2012-07-10 NOTE — ED Provider Notes (Signed)
Medical screening examination/treatment/procedure(s) were performed by non-physician practitioner and as supervising physician I was immediately available for consultation/collaboration.  Gilda Crease, MD 07/10/12 [phone]

## 2013-04-14 ENCOUNTER — Encounter (HOSPITAL_BASED_OUTPATIENT_CLINIC_OR_DEPARTMENT_OTHER): Payer: Self-pay | Admitting: Emergency Medicine

## 2013-04-14 ENCOUNTER — Emergency Department (HOSPITAL_BASED_OUTPATIENT_CLINIC_OR_DEPARTMENT_OTHER)
Admission: EM | Admit: 2013-04-14 | Discharge: 2013-04-14 | Disposition: A | Discharge status: Home / Self Care | Payer: 59 | Attending: Emergency Medicine | Admitting: Emergency Medicine

## 2013-04-14 ENCOUNTER — Emergency Department (HOSPITAL_BASED_OUTPATIENT_CLINIC_OR_DEPARTMENT_OTHER): Payer: 59

## 2013-04-14 DIAGNOSIS — Z862 Personal history of diseases of the blood and blood-forming organs and certain disorders involving the immune mechanism: Secondary | ICD-10-CM | POA: Insufficient documentation

## 2013-04-14 DIAGNOSIS — F411 Generalized anxiety disorder: Secondary | ICD-10-CM | POA: Insufficient documentation

## 2013-04-14 DIAGNOSIS — R0789 Other chest pain: Secondary | ICD-10-CM | POA: Insufficient documentation

## 2013-04-14 DIAGNOSIS — R079 Chest pain, unspecified: Secondary | ICD-10-CM

## 2013-04-14 DIAGNOSIS — Z79899 Other long term (current) drug therapy: Secondary | ICD-10-CM | POA: Insufficient documentation

## 2013-04-14 DIAGNOSIS — R42 Dizziness and giddiness: Secondary | ICD-10-CM | POA: Insufficient documentation

## 2013-04-14 DIAGNOSIS — G43909 Migraine, unspecified, not intractable, without status migrainosus: Secondary | ICD-10-CM | POA: Insufficient documentation

## 2013-04-14 DIAGNOSIS — E079 Disorder of thyroid, unspecified: Secondary | ICD-10-CM | POA: Insufficient documentation

## 2013-04-14 DIAGNOSIS — E119 Type 2 diabetes mellitus without complications: Secondary | ICD-10-CM | POA: Insufficient documentation

## 2013-04-14 DIAGNOSIS — F329 Major depressive disorder, single episode, unspecified: Secondary | ICD-10-CM | POA: Insufficient documentation

## 2013-04-14 DIAGNOSIS — J45901 Unspecified asthma with (acute) exacerbation: Secondary | ICD-10-CM | POA: Insufficient documentation

## 2013-04-14 DIAGNOSIS — F3289 Other specified depressive episodes: Secondary | ICD-10-CM | POA: Insufficient documentation

## 2013-04-14 DIAGNOSIS — F17200 Nicotine dependence, unspecified, uncomplicated: Secondary | ICD-10-CM | POA: Insufficient documentation

## 2013-04-14 LAB — TROPONIN I
Troponin I: <0.3 ng/mL (ref <0.30)
Troponin I: <0.3 ng/mL (ref <0.30)

## 2013-04-14 IMAGING — CR DG CHEST 2V
2 series · 2 of 2 positions shown · non-contrast
Comparison: None.

CLINICAL DATA: Fatigue.  Hypertension and nausea.  Chest pain

EXAM:
CHEST  2 VIEW

[w chest pa]
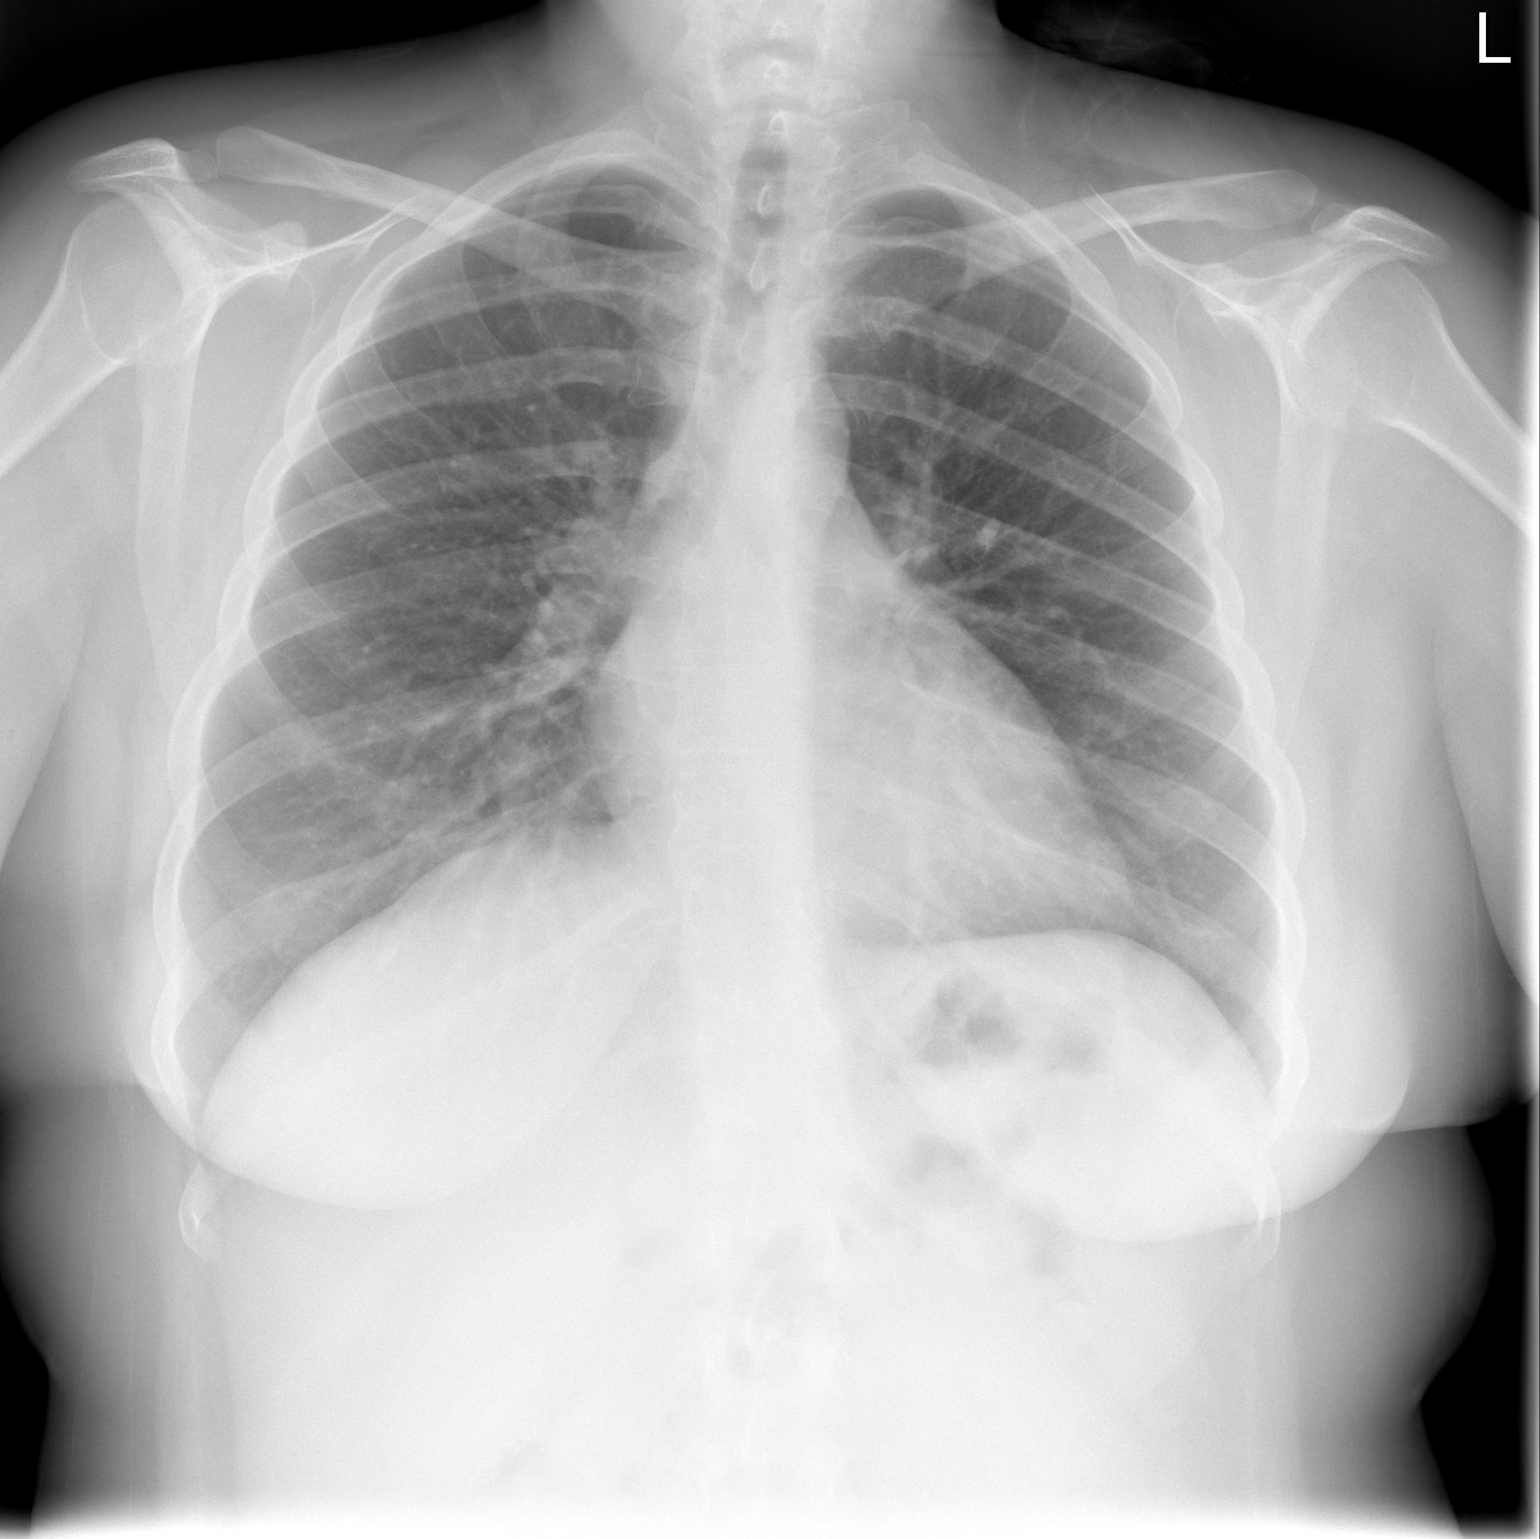

[w chest lat]
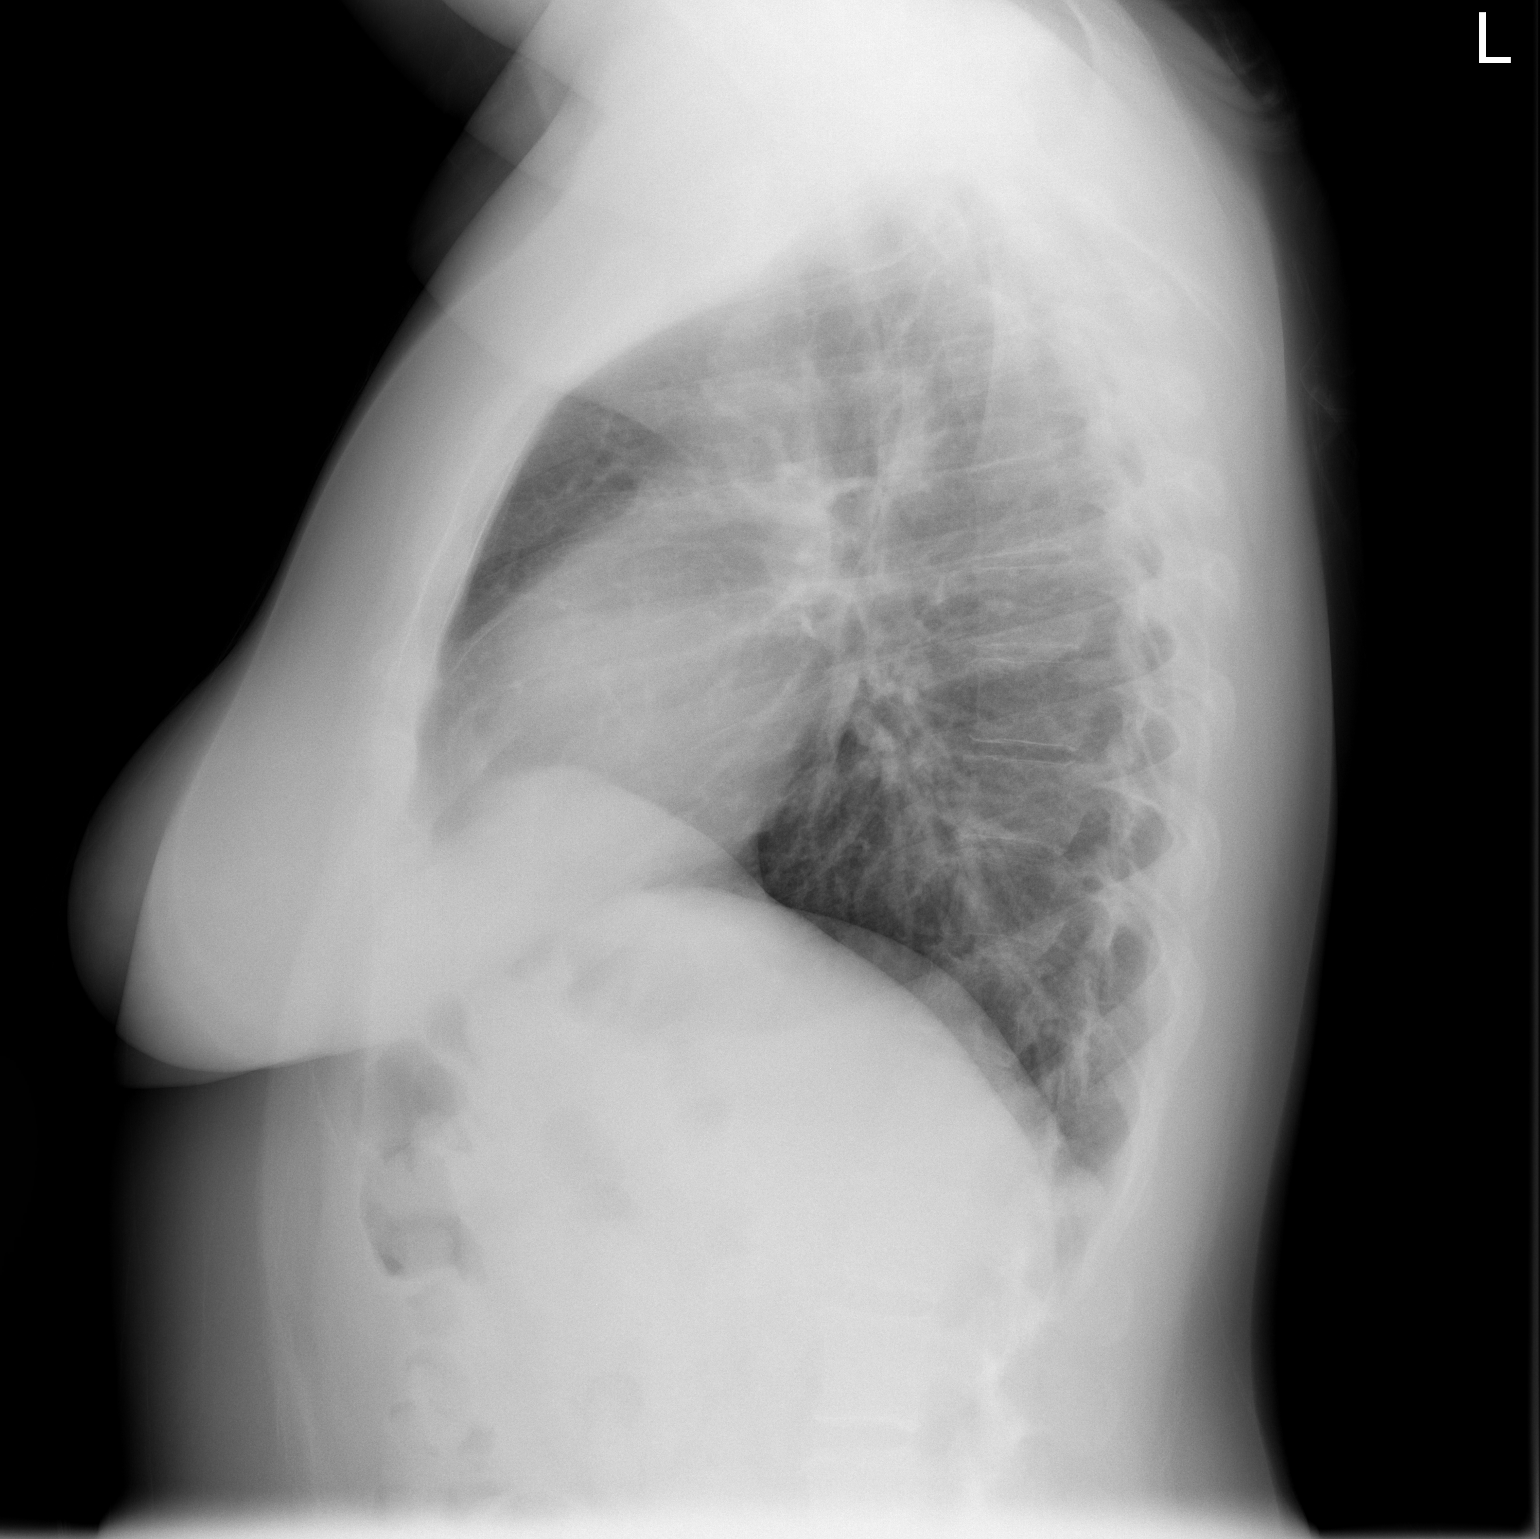

[2 of 2 positions shown; findings below may reference images not displayed]

FINDINGS: The heart size and mediastinal contours are within normal limits.
Both lungs are clear. The visualized skeletal structures are
unremarkable.
IMPRESSION: No active cardiopulmonary disease.

## 2013-04-14 MED ORDER — ASPIRIN 81 MG PO CHEW
324.0000 mg | CHEWABLE_TABLET | Freq: Once | ORAL | Status: AC
  Administered 2013-04-14: 324 mg via ORAL
  Filled 2013-04-14: qty 4

## 2013-04-14 MED ORDER — IBUPROFEN 600 MG PO TABS: 600.0000 mg | ORAL_TABLET | Freq: Four times a day (QID) | ORAL | Status: DC | PRN

## 2013-04-14 MED ORDER — KETOROLAC TROMETHAMINE 30 MG/ML IJ SOLN
30.0000 mg | Freq: Once | INTRAMUSCULAR | Status: AC
  Administered 2013-04-14: 30 mg via INTRAVENOUS
  Filled 2013-04-14: qty 1

## 2013-04-14 NOTE — ED Notes (Signed)
Pt has been checking bp for few days and it has been elevated.  Saw PMD yesterday.  No med rx.  Wants pt to monitor it for a while.  Today she is weak, dizzy, lightheaded, and has low central chest pressure.

## 2013-04-14 NOTE — ED Provider Notes (Signed)
[name]: [id number]     Arrival date & time 04/14/13  1640 History   First MD Initiated Contact with Patient 04/14/13 1654     Chief Complaint  Patient presents with  . Fatigue  . Hypertension  . Nausea  . Chest Pain   (Consider location/radiation/quality/duration/timing/severity/associated sxs/prior Treatment) HPI  This a 32 year old female with history of diabetes or thyroid disease who presents with fatigue, blood pressure, and chest pain. Patient reports that she had onset of symptoms this past weekend. She reports that she checked her blood pressure and noted it to be elevated to 149/98. She describes pressure-like chest pain and heaviness in the setting of high blood pressure.  The pain comes and goes but there is not an exertional component. She reports that her pain today started around 2 PM. Currently she rates her pain at 6/10. She does report an early family history of heart disease. She does not take aspirin.  She is a current smoker. Patient was evaluated by her primary physician yesterday and was not placed on any blood pressure medication. She was instructed to continue to monitor. Patient also reports feeling lightheaded. She denies any room spinning dizziness. Patient does report increased stress.  Patient denies any leg swelling, history of blood clots, recent hospitalizations.   Past Medical History  Diagnosis Date  . Allergy   . Anemia   . Depression   . Diabetes mellitus without complication   . Asthma   . Thyroid disease   . Anxiety   . Migraine    Past Surgical History  Procedure Laterality Date  . Abdominal hysterectomy    . Laparoscopy abdomen diagnostic    . Tonsillectomy     No family history on file. History  Substance Use Topics  . Smoking status: Current Every Day Smoker -- 1.00 packs/day for 20 years    Types: Cigarettes  . Smokeless tobacco: Not on file  . Alcohol Use: Yes     Comment: weekly   OB History   Grav Para Term Preterm Abortions TAB  SAB Ect Mult Living                 Review of Systems  Constitutional: Negative for fever.  Respiratory: Positive for chest tightness and shortness of breath. Negative for cough.   Cardiovascular: Positive for chest pain.  Gastrointestinal: Negative for nausea, vomiting and abdominal pain.  Genitourinary: Negative for dysuria.  Musculoskeletal: Negative for back pain.  Skin: Negative for wound.  Neurological: Positive for light-headedness. Negative for headaches.  Psychiatric/Behavioral: Negative for confusion.  All other systems reviewed and are negative.    Allergies  Imitrex and Triptans  Home Medications   Current Outpatient Rx  Name  Route  Sig  Dispense  Refill  . ALPRAZolam (XANAX PO)   Oral   Take by mouth.         . ARIPiprazole (ABILIFY PO)   Oral   Take by mouth.         Marland Kitchen Butalbital-APAP-Caffeine (FIORICET PO)   Oral   Take by mouth.         Marland Kitchen HYDROcodone-acetaminophen (NORCO/VICODIN) 5-325 MG per tablet   Oral   Take 1 tablet by mouth at bedtime as needed for pain.   12 tablet   0   . ibuprofen (ADVIL,MOTRIN) 600 MG tablet   Oral   Take 1 tablet (600 mg total) by mouth every 6 (six) hours as needed.   30 tablet   0   .  LEVOTHYROXINE SODIUM PO   Oral   Take by mouth.         . meloxicam (MOBIC) 15 MG tablet      1 pill daily for 1 week then as needed.   30 tablet   0   . Rosuvastatin Calcium (CRESTOR PO)   Oral   Take by mouth.         . sertraline (ZOLOFT) 100 MG tablet   Oral   Take 2 tablets (200 mg total) by mouth daily.   60 tablet   5   . TIZANIDINE HCL PO   Oral   Take by mouth.         Marland Kitchen UNABLE TO FIND      Insulin pump-left arm          BP 116/76  Pulse 67  Temp(Src) 98 F (36.7 C) (Oral)  Resp 18  Ht 5' (1.524 m)  Wt 195 lb (88.451 kg)  BMI 38.08 kg/m2  SpO2 100% Physical Exam  Nursing note and vitals reviewed. Constitutional: She is oriented to person, place, and time. She appears  well-developed and well-nourished. No distress.  HENT:  Head: Normocephalic and atraumatic.  Neck: Neck supple.  Cardiovascular: Normal rate, regular rhythm and normal heart sounds.   No murmur heard. Pulmonary/Chest: Effort normal. No respiratory distress. She has no wheezes. She exhibits tenderness.  Abdominal: Soft. There is no tenderness.  Musculoskeletal: She exhibits no edema.  Neurological: She is alert and oriented to person, place, and time.  Skin: Skin is warm and dry.  Psychiatric: She has a normal mood and affect.    ED Course  Procedures (including critical care time) Labs Review Labs Reviewed  TROPONIN I  TROPONIN I   Imaging Review Dg Chest 2 View  04/14/2013   CLINICAL DATA:  Fatigue.  Hypertension and nausea.  Chest pain  EXAM: CHEST  2 VIEW  COMPARISON:  None.  FINDINGS: The heart size and mediastinal contours are within normal limits. Both lungs are clear. The visualized skeletal structures are unremarkable.  IMPRESSION: No active cardiopulmonary disease.   Electronically Signed   By: Signa Kell M.D.   On: 04/14/2013 18:12    EKG Interpretation    Date/Time:  Tuesday April 14 2013 16:55:30 EST Ventricular Rate:  75 PR Interval:  140 QRS Duration: 86 QT Interval:  396 QTC Calculation: 442 R Axis:   -12 Text Interpretation:  Normal sinus rhythm Minimal voltage criteria for LVH, may be normal variant Borderline ECG No prior for comparison Confirmed by Emauri Krygier  MD, Japji Kok ([id number]) on 04/14/2013 5:53:17 PM           Medications  aspirin chewable tablet 324 mg (324 mg Oral Given 04/14/13 1745)  ketorolac (TORADOL) 30 MG/ML injection 30 mg (30 mg Intravenous Given 04/14/13 1851)    MDM   1. Chest pain    This is a 32 year old female who presents with chest pain and concern for high blood pressure. She is nontoxic-appearing on exam. Blood pressures while in the ER were within normal limits. Patient has risk factors for heart disease including early  family history, hypertension, and current smoking. However, she does have reproducible chest pain as well. She is low risk for PE and PERC negative.  Troponin is negative. EKG is nonischemic. Patient was given Toradol and aspirin. She did have some improvement of her pain with anti-inflammatories.  A delta troponin was also negative. Suspect patient's chest pain may be partially musculoskeletal in nature  given reproducible pain on exam and improvement with NSAIDs. She has a TIMI risk score of 1. She has close followup with her primary care physician and will followup for referral to cardiology she continues to have symptoms.  After history, exam, and medical workup I feel the patient has been appropriately medically screened and is safe for discharge home. Pertinent diagnoses were discussed with the patient. Patient was given return precautions.     Shon Baton, MD 04/15/13 0001

## 2013-07-20 DIAGNOSIS — R5381 Other malaise: Secondary | ICD-10-CM | POA: Insufficient documentation

## 2013-07-20 DIAGNOSIS — H6990 Unspecified Eustachian tube disorder, unspecified ear: Secondary | ICD-10-CM | POA: Insufficient documentation

## 2013-07-20 DIAGNOSIS — J069 Acute upper respiratory infection, unspecified: Secondary | ICD-10-CM | POA: Insufficient documentation

## 2013-07-20 DIAGNOSIS — E119 Type 2 diabetes mellitus without complications: Secondary | ICD-10-CM | POA: Insufficient documentation

## 2014-02-10 DIAGNOSIS — F99 Mental disorder, not otherwise specified: Secondary | ICD-10-CM | POA: Insufficient documentation

## 2014-06-24 DIAGNOSIS — Z90710 Acquired absence of both cervix and uterus: Secondary | ICD-10-CM | POA: Insufficient documentation

## 2014-07-19 DIAGNOSIS — E559 Vitamin D deficiency, unspecified: Secondary | ICD-10-CM | POA: Insufficient documentation

## 2014-07-19 DIAGNOSIS — E669 Obesity, unspecified: Secondary | ICD-10-CM | POA: Insufficient documentation

## 2014-07-19 DIAGNOSIS — E785 Hyperlipidemia, unspecified: Secondary | ICD-10-CM | POA: Insufficient documentation

## 2014-07-23 ENCOUNTER — Encounter (HOSPITAL_COMMUNITY): Payer: Self-pay | Admitting: Emergency Medicine

## 2014-07-23 ENCOUNTER — Emergency Department (HOSPITAL_COMMUNITY): Payer: Medicaid Other

## 2014-07-23 ENCOUNTER — Emergency Department (HOSPITAL_COMMUNITY)
Admission: EM | Admit: 2014-07-23 | Discharge: 2014-07-23 | Disposition: A | Discharge status: Home / Self Care | Payer: Medicaid Other | Attending: Emergency Medicine | Admitting: Emergency Medicine

## 2014-07-23 DIAGNOSIS — F419 Anxiety disorder, unspecified: Secondary | ICD-10-CM | POA: Insufficient documentation

## 2014-07-23 DIAGNOSIS — G43909 Migraine, unspecified, not intractable, without status migrainosus: Secondary | ICD-10-CM | POA: Diagnosis not present

## 2014-07-23 DIAGNOSIS — Z79899 Other long term (current) drug therapy: Secondary | ICD-10-CM | POA: Insufficient documentation

## 2014-07-23 DIAGNOSIS — Z862 Personal history of diseases of the blood and blood-forming organs and certain disorders involving the immune mechanism: Secondary | ICD-10-CM | POA: Insufficient documentation

## 2014-07-23 DIAGNOSIS — Z87891 Personal history of nicotine dependence: Secondary | ICD-10-CM | POA: Diagnosis not present

## 2014-07-23 DIAGNOSIS — R0602 Shortness of breath: Secondary | ICD-10-CM | POA: Diagnosis present

## 2014-07-23 DIAGNOSIS — E119 Type 2 diabetes mellitus without complications: Secondary | ICD-10-CM | POA: Diagnosis not present

## 2014-07-23 DIAGNOSIS — F329 Major depressive disorder, single episode, unspecified: Secondary | ICD-10-CM | POA: Insufficient documentation

## 2014-07-23 DIAGNOSIS — J45901 Unspecified asthma with (acute) exacerbation: Secondary | ICD-10-CM | POA: Diagnosis not present

## 2014-07-23 DIAGNOSIS — Z7951 Long term (current) use of inhaled steroids: Secondary | ICD-10-CM | POA: Insufficient documentation

## 2014-07-23 DIAGNOSIS — E079 Disorder of thyroid, unspecified: Secondary | ICD-10-CM | POA: Diagnosis not present

## 2014-07-23 DIAGNOSIS — Z794 Long term (current) use of insulin: Secondary | ICD-10-CM | POA: Diagnosis not present

## 2014-07-23 LAB — CBC
HEMATOCRIT: 40.5 % (ref 36.0–46.0)
Hemoglobin: 13.8 g/dL (ref 12.0–15.0)
MCH: 30.5 pg (ref 26.0–34.0)
MCHC: 34.1 g/dL (ref 30.0–36.0)
MCV: 89.6 fL (ref 78.0–100.0)
PLATELETS: 346 10*3/uL (ref 150–400)
RBC: 4.52 MIL/uL (ref 3.87–5.11)
RDW: 12.6 % (ref 11.5–15.5)
WBC: 11.4 10*3/uL — ABNORMAL HIGH (ref 4.0–10.5)

## 2014-07-23 LAB — COMPREHENSIVE METABOLIC PANEL
ALK PHOS: 80 U/L (ref 39–117)
ALT: 14 U/L (ref 0–35)
ANION GAP: 10 (ref 5–15)
AST: 16 U/L (ref 0–37)
Albumin: 4.1 g/dL (ref 3.5–5.2)
BILIRUBIN TOTAL: 0.8 mg/dL (ref 0.3–1.2)
BUN: 16 mg/dL (ref 6–23)
CHLORIDE: 104 mmol/L (ref 96–112)
CO2: 20 mmol/L (ref 19–32)
Calcium: 9.5 mg/dL (ref 8.4–10.5)
Creatinine, Ser: 0.77 mg/dL (ref 0.50–1.10)
Glucose, Bld: 337 mg/dL — ABNORMAL HIGH (ref 70–99)
POTASSIUM: 3.4 mmol/L — AB (ref 3.5–5.1)
Sodium: 134 mmol/L — ABNORMAL LOW (ref 135–145)
Total Protein: 7.5 g/dL (ref 6.0–8.3)

## 2014-07-23 LAB — I-STAT TROPONIN, ED: TROPONIN I, POC: 0.01 ng/mL (ref 0.00–0.08)

## 2014-07-23 LAB — CBG MONITORING, ED: GLUCOSE-CAPILLARY: 233 mg/dL — AB (ref 70–99)

## 2014-07-23 IMAGING — CR DG CHEST 2V
2 series · 2 of 2 positions shown · non-contrast
Comparison: [DATE]

CLINICAL DATA: Chest pain and shortness of breath

EXAM:
CHEST  2 VIEW

[w chest pa]
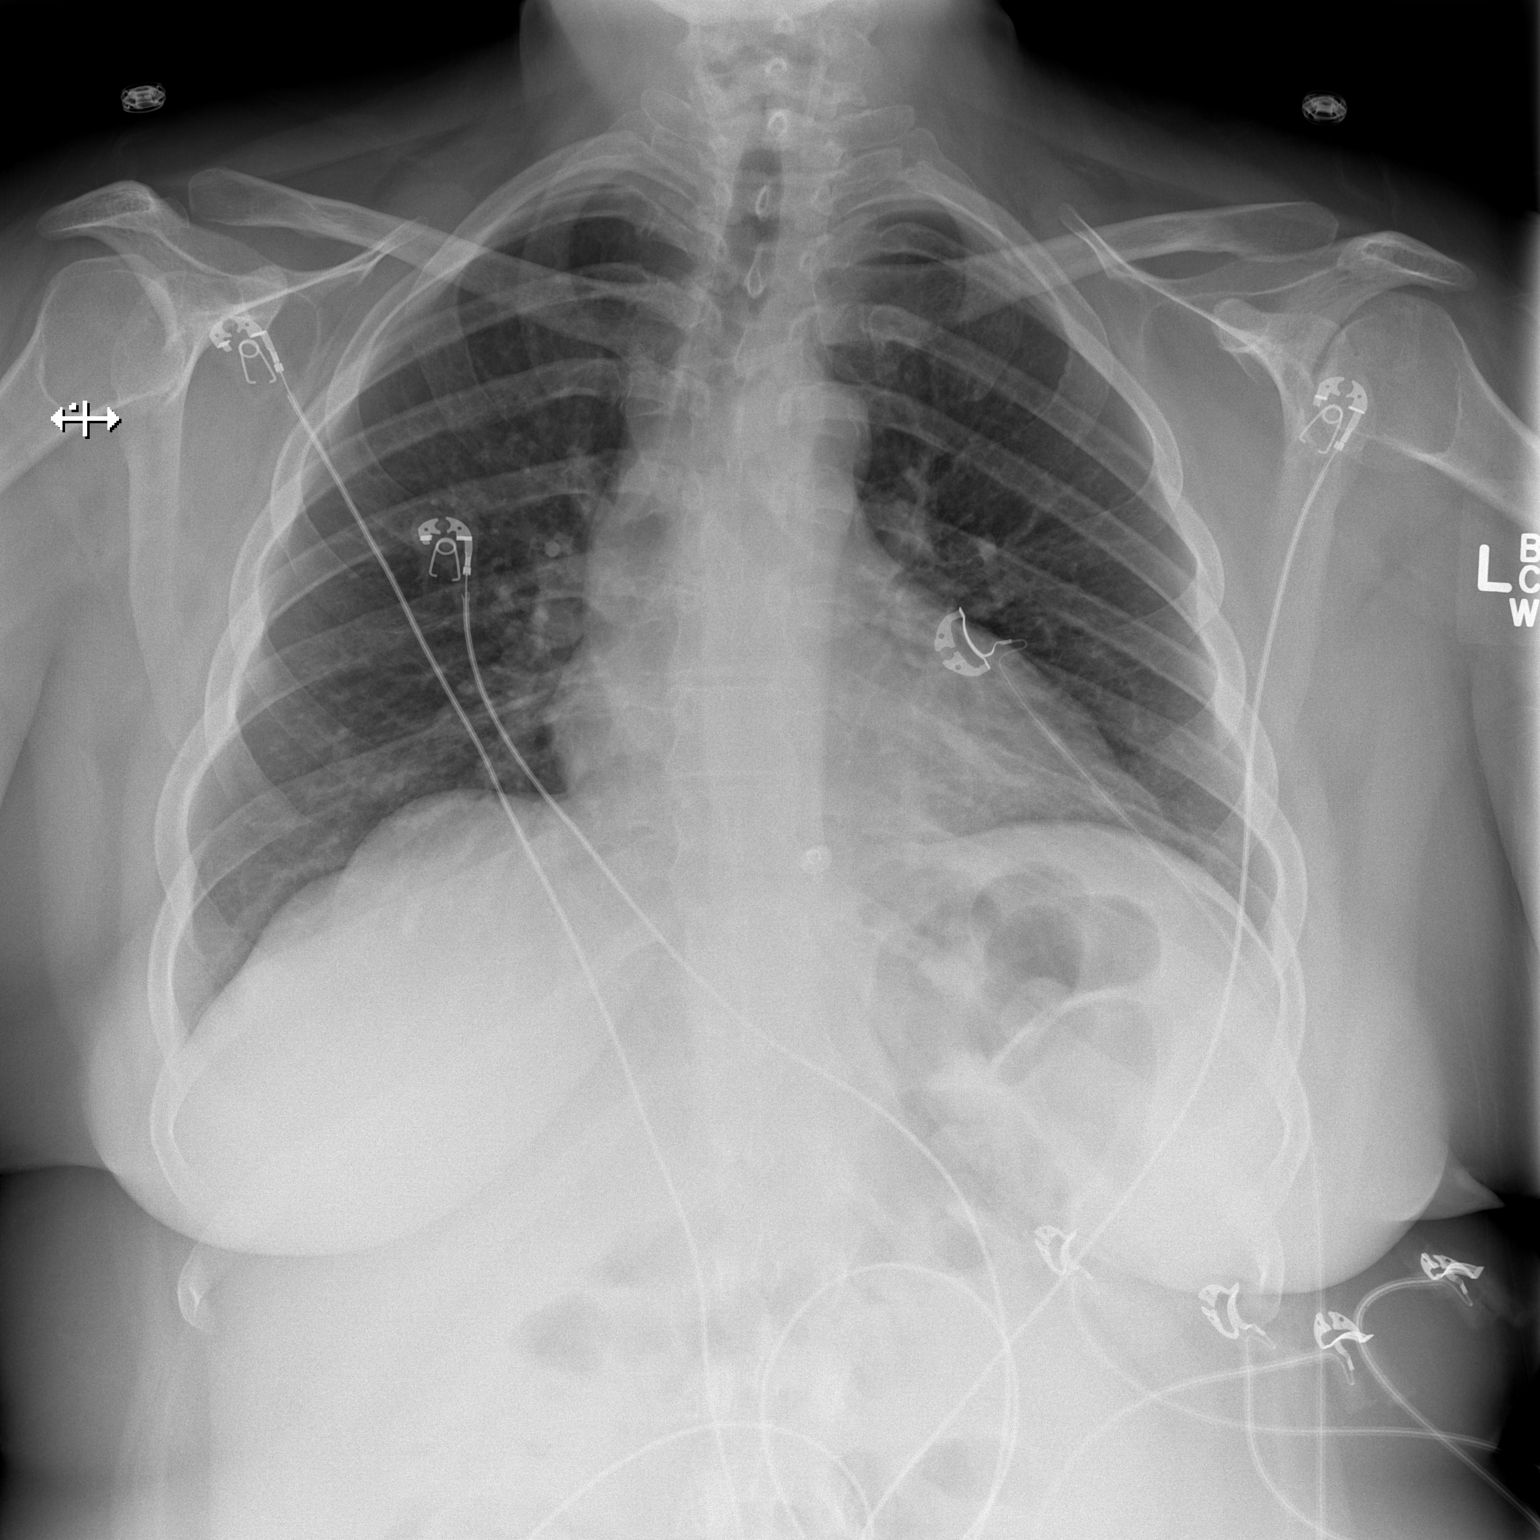

[w chest lat]
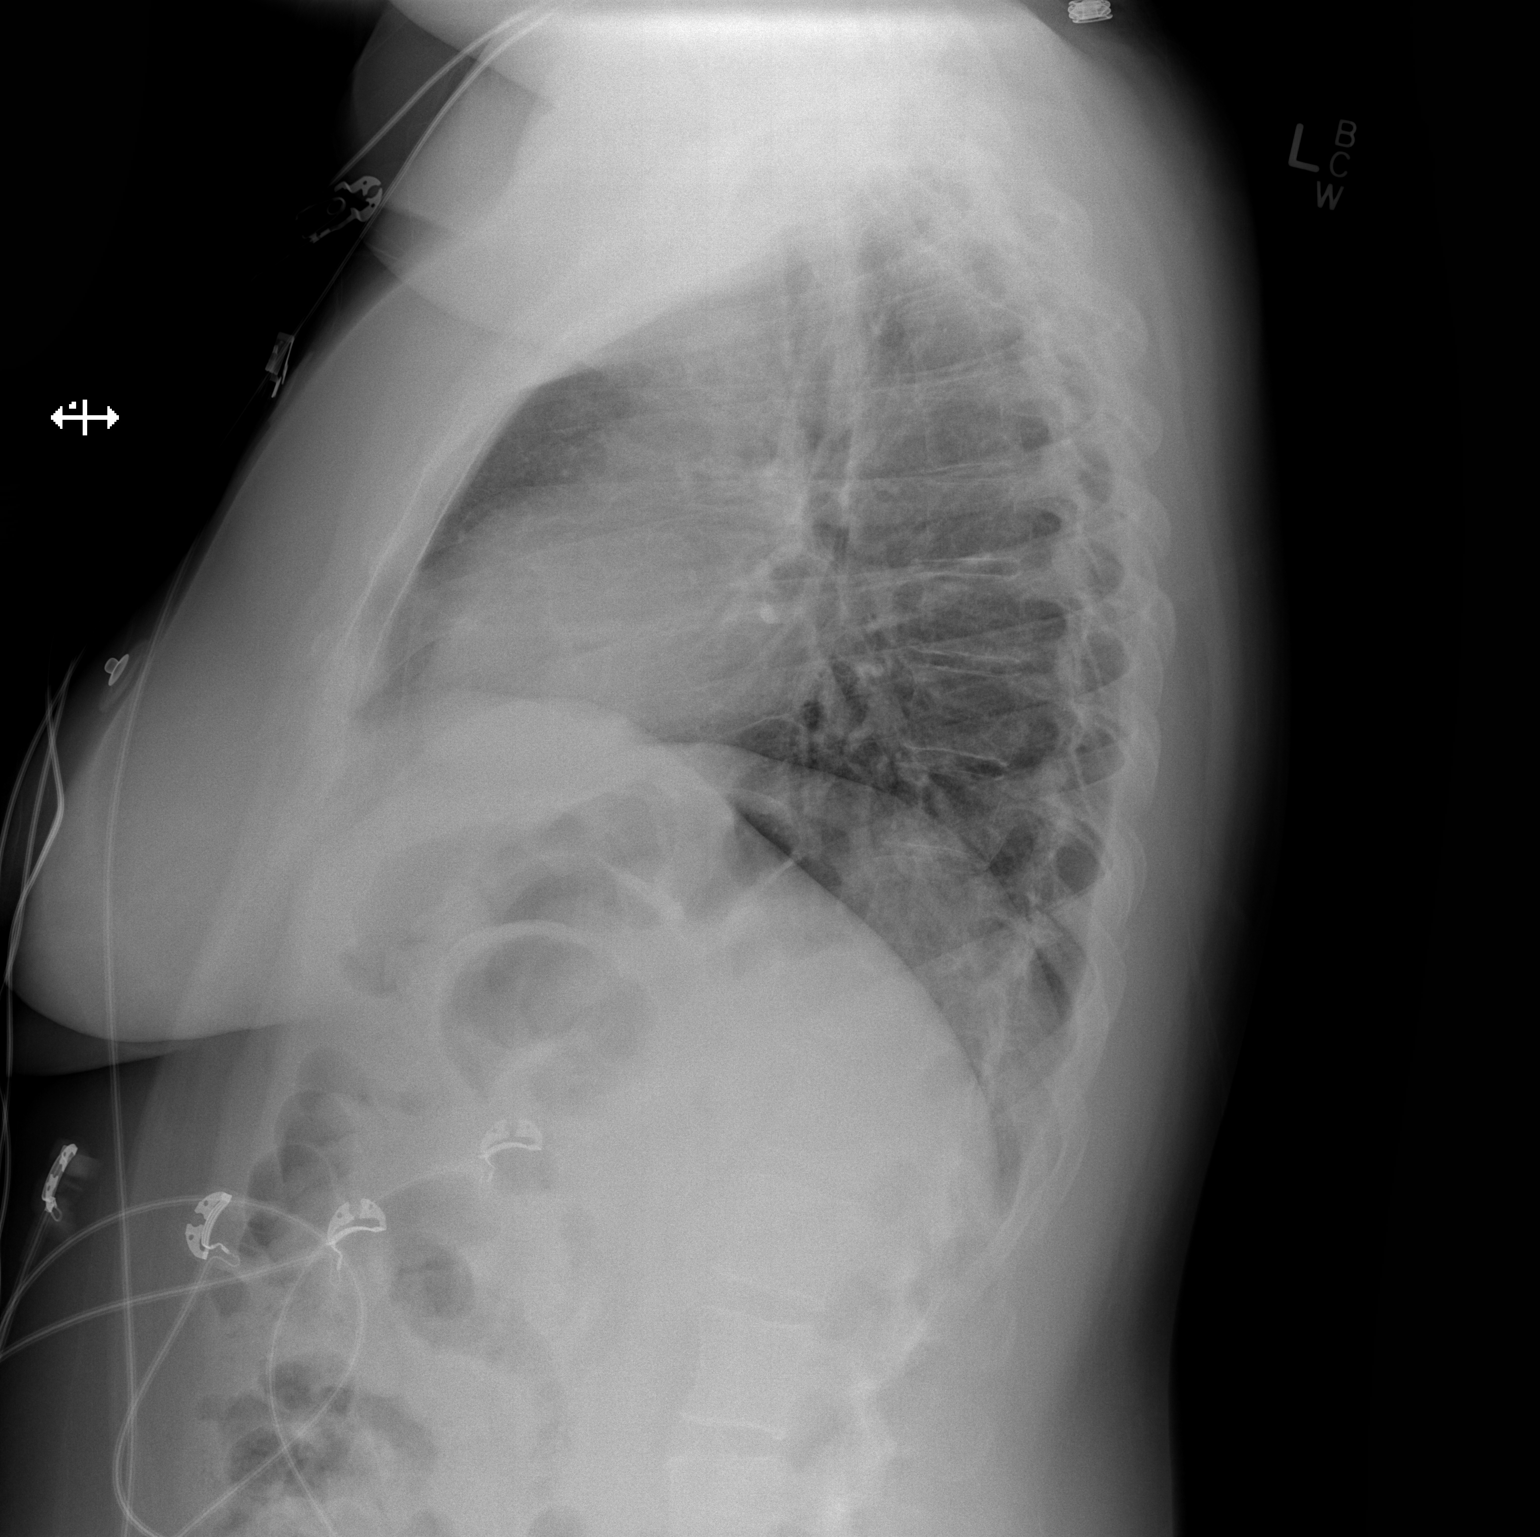

[2 of 2 positions shown; findings below may reference images not displayed]

FINDINGS: The heart size and mediastinal contours are within normal limits.
Both lungs are clear. The visualized skeletal structures are
unremarkable.
IMPRESSION: No active cardiopulmonary disease.

## 2014-07-23 MED ORDER — ALBUTEROL SULFATE (2.5 MG/3ML) 0.083% IN NEBU
5.0000 mg | INHALATION_SOLUTION | Freq: Once | RESPIRATORY_TRACT | Status: AC
  Administered 2014-07-23: 5 mg via RESPIRATORY_TRACT
  Filled 2014-07-23: qty 6

## 2014-07-23 MED ORDER — IPRATROPIUM BROMIDE 0.02 % IN SOLN
0.5000 mg | Freq: Once | RESPIRATORY_TRACT | Status: AC
  Administered 2014-07-23: 0.5 mg via RESPIRATORY_TRACT
  Filled 2014-07-23: qty 2.5

## 2014-07-23 MED ORDER — SODIUM CHLORIDE 0.9 % IV BOLUS (SEPSIS)
1000.0000 mL | Freq: Once | INTRAVENOUS | Status: AC
  Administered 2014-07-23: 1000 mL via INTRAVENOUS

## 2014-07-23 NOTE — Discharge Instructions (Signed)
Return to the ED with any concerns including chest pain, difficulty breathing, fainting, leg swelling, vomiting and not able to keep down liquids, decreased level of alertness/lethargy, or any other alarming symptoms  You should continue using the albuterol inhaler 2 puffs every 4 hours

## 2014-07-23 NOTE — ED Notes (Signed)
CBG 233. RN made aware.

## 2014-07-23 NOTE — ED Notes (Signed)
Per EMS, Pt from work, pt sts she has been having periods of SOB x 1 week. On Monday she was seen at PCP for upper respiratory infection and was given an inhaler. Pt walked across the street today to get a drink and began to have severe SOB and called EMS. When EMS arrived pt was breathing fast, 100% on room air, lung sounds clear. Pt was describing tightness in her airway. No hives or rashes noted. A&Ox4. Ambulatory.

## 2014-07-23 NOTE — ED Notes (Signed)
Bed: WA02 Expected date:  Expected time:  Means of arrival:  Comments: EMS-SOB 

## 2014-07-23 NOTE — ED Notes (Signed)
Patient transported to X-ray 

## 2014-07-23 NOTE — ED Provider Notes (Signed)
[name]: [id number]     Arrival date & time 07/23/14  1418 History   First MD Initiated Contact with Patient 07/23/14 1504     Chief Complaint  Patient presents with  . Shortness of Breath     (Consider location/radiation/quality/duration/timing/severity/associated sxs/prior Treatment) HPI  Pt presenting with c/o shortness of breath.  Pt states that she was diagnosed with URI 5 days ago and was prescribed zpack and albuterol MDI.  She states that she has been intermittently feeling short of breath.  No chest pain.  Mild but nonproductive cough.  She states she feels that she cannot get enough air at times.  No fever/chills.  Has been eating and drinking normally without vomiting or diarrhea.  No leg swelling.  No hx of DVT/PE.  No recent travel/trauma/surgery.  No OCPs or hormone replacement.  There are no other associated systemic symptoms, there are no other alleviating or modifying factors.   Past Medical History  Diagnosis Date  . Allergy   . Anemia   . Depression   . Diabetes mellitus without complication   . Asthma   . Thyroid disease   . Anxiety   . Migraine    Past Surgical History  Procedure Laterality Date  . Abdominal hysterectomy    . Laparoscopy abdomen diagnostic    . Tonsillectomy     No family history on file. History  Substance Use Topics  . Smoking status: Former Smoker -- 1.00 packs/day for 20 years    Types: Cigarettes  . Smokeless tobacco: Not on file  . Alcohol Use: Yes     Comment: weekly   OB History    No data available     Review of Systems  ROS reviewed and all otherwise negative except for mentioned in HPI    Allergies  Imitrex and Triptans  Home Medications   Prior to Admission medications   Medication Sig Start Date End Date Taking? Authorizing Provider  albuterol (PROVENTIL HFA;VENTOLIN HFA) 108 (90 BASE) MCG/ACT inhaler Inhale 2 puffs into the lungs every 4 (four) hours as needed for wheezing or shortness of breath.   Yes Historical  Provider, MD  ALPRAZolam Prudy Feeler) 1 MG tablet Take 1 mg by mouth at bedtime.   Yes Historical Provider, MD  azithromycin (ZITHROMAX) 250 MG tablet Take 250 mg by mouth. Take two tablets on first day then one tablet daily until complete.   Yes Historical Provider, MD  butalbital-acetaminophen-caffeine (FIORICET, ESGIC) 50-325-40 MG per tablet Take 1 tablet by mouth every 4 (four) hours as needed for headache.   Yes Historical Provider, MD  fluticasone (FLONASE) 50 MCG/ACT nasal spray Place 2 sprays into both nostrils 2 (two) times daily.   Yes Historical Provider, MD  guaiFENesin (MUCINEX) 600 MG 12 hr tablet Take 1,200 mg by mouth 2 (two) times daily.   Yes Historical Provider, MD  ibuprofen (ADVIL,MOTRIN) 200 MG tablet Take 200-400 mg by mouth every 6 (six) hours as needed for headache or moderate pain.   Yes Historical Provider, MD  Insulin Human (INSULIN PUMP) SOLN Inject into the skin. Pump in left arm--Humalog.   Yes Historical Provider, MD  levothyroxine (SYNTHROID, LEVOTHROID) 175 MCG tablet Take 175 mcg by mouth at bedtime.    Yes Historical Provider, MD  rosuvastatin (CRESTOR) 40 MG tablet Take 40 mg by mouth at bedtime.   Yes Historical Provider, MD  tiZANidine (ZANAFLEX) 4 MG tablet Take 4 mg by mouth every 6 (six) hours as needed for muscle spasms.   Yes  Historical Provider, MD  ALPRAZolam (XANAX PO) Take by mouth.    Historical Provider, MD  HYDROcodone-acetaminophen (NORCO/VICODIN) 5-325 MG per tablet Take 1 tablet by mouth at bedtime as needed for pain. Patient not taking: Reported on 07/23/2014 06/10/12   Sherren MochaEva N Shaw, MD  ibuprofen (ADVIL,MOTRIN) 600 MG tablet Take 1 tablet (600 mg total) by mouth every 6 (six) hours as needed. Patient not taking: Reported on 07/23/2014 04/14/13   Shon Batonourtney F Horton, MD  meloxicam (MOBIC) 15 MG tablet 1 pill daily for 1 week then as needed. Patient not taking: Reported on 07/23/2014 06/04/12   Judi SaaZachary M Smith, DO  sertraline (ZOLOFT) 100 MG tablet Take 2  tablets (200 mg total) by mouth daily. Patient not taking: Reported on 07/23/2014 06/17/12   Sherren MochaEva N Shaw, MD   BP 120/70 mmHg  Pulse 88  Temp(Src) 97.9 F (36.6 C) (Oral)  Resp 16  SpO2 100%  Vitals reviewed Physical Exam  Physical Examination: General appearance - alert, well appearing, and in no distress Mental status - alert, oriented to person, place, and time Eyes - no conjunctival injection, no scleral icterus Mouth - mucous membranes moist, pharynx normal without lesions Chest - clear to auscultation, no wheezes, rales or rhonchi, symmetric air entry, normal respiratory effort Heart - normal rate, regular rhythm, normal S1, S2, no murmurs, rubs, clicks or gallops Abdomen - soft, nontender, nondistended, no masses or organomegaly Extremities - peripheral pulses normal, no pedal edema, no clubbing or cyanosis Skin - normal coloration and turgor, no rashes  ED Course  Procedures (including critical care time) Labs Review Labs Reviewed  CBC - Abnormal; Notable for the following:    WBC 11.4 (*)    All other components within normal limits  COMPREHENSIVE METABOLIC PANEL - Abnormal; Notable for the following:    Sodium 134 (*)    Potassium 3.4 (*)    Glucose, Bld 337 (*)    All other components within normal limits  CBG MONITORING, ED - Abnormal; Notable for the following:    Glucose-Capillary 233 (*)    All other components within normal limits  I-STAT TROPOININ, ED    Imaging Review Dg Chest 2 View  07/23/2014   CLINICAL DATA:  Chest pain and shortness of breath  EXAM: CHEST  2 VIEW  COMPARISON:  04/02/2014  FINDINGS: The heart size and mediastinal contours are within normal limits. Both lungs are clear. The visualized skeletal structures are unremarkable.  IMPRESSION: No active cardiopulmonary disease.   Electronically Signed   By: Alcide CleverMark  Lukens M.D.   On: 07/23/2014 15:15     EKG Interpretation   Date/Time:  Friday July 23 2014 14:28:16 EST Ventricular Rate:  75 PR  Interval:  129 QRS Duration: 108 QT Interval:  478 QTC Calculation: 534 R Axis:   -20 Text Interpretation:  Sinus rhythm Multiform ventricular premature  complexes Borderline left axis deviation Probable anterior infarct, age  indeterminate Prolonged QT interval Since previous tracing QT interval is  prolonged and PVCs are new Confirmed by Karma GanjaLINKER  MD, Annisa Mazzarella [phone]([id number]) on  07/23/2014 3:15:44 PM      MDM   Final diagnoses:  Shortness of breath    Pt presenting with shortness of breath associated with recent diagnosis of URI/OM- pt is finishing zpack today.  She has clear lungs, normal respiratory effort.  No tachypnea or hypoxia.  She is PERC 0.  CXR is reasssuring no signs of pneumonia or pulmonary edema.  Pt feels some improvement after albuterol neb  in the ED.  Pt encouraged to use scheduled albuterol MDI q4 hours at home.   Discharged with strict return precautions.  Pt agreeable with plan.    Jerelyn Scott, MD 07/23/14 [phone]

## 2014-08-31 DIAGNOSIS — R002 Palpitations: Secondary | ICD-10-CM | POA: Insufficient documentation

## 2014-08-31 DIAGNOSIS — I493 Ventricular premature depolarization: Secondary | ICD-10-CM | POA: Insufficient documentation

## 2014-09-07 DIAGNOSIS — R079 Chest pain, unspecified: Secondary | ICD-10-CM | POA: Insufficient documentation

## 2016-02-15 DIAGNOSIS — R45851 Suicidal ideations: Secondary | ICD-10-CM | POA: Insufficient documentation

## 2016-06-26 ENCOUNTER — Emergency Department (HOSPITAL_COMMUNITY): Payer: 59

## 2016-06-26 ENCOUNTER — Encounter (HOSPITAL_COMMUNITY): Payer: Self-pay | Admitting: Emergency Medicine

## 2016-06-26 ENCOUNTER — Emergency Department (HOSPITAL_COMMUNITY)
Admission: EM | Admit: 2016-06-26 | Discharge: 2016-06-26 | Disposition: A | Discharge status: Home / Self Care | Payer: 59 | Attending: Emergency Medicine | Admitting: Emergency Medicine

## 2016-06-26 DIAGNOSIS — Z794 Long term (current) use of insulin: Secondary | ICD-10-CM | POA: Insufficient documentation

## 2016-06-26 DIAGNOSIS — Z79899 Other long term (current) drug therapy: Secondary | ICD-10-CM | POA: Insufficient documentation

## 2016-06-26 DIAGNOSIS — Z87891 Personal history of nicotine dependence: Secondary | ICD-10-CM | POA: Insufficient documentation

## 2016-06-26 DIAGNOSIS — M25562 Pain in left knee: Secondary | ICD-10-CM | POA: Insufficient documentation

## 2016-06-26 DIAGNOSIS — E119 Type 2 diabetes mellitus without complications: Secondary | ICD-10-CM | POA: Insufficient documentation

## 2016-06-26 DIAGNOSIS — J45909 Unspecified asthma, uncomplicated: Secondary | ICD-10-CM | POA: Diagnosis not present

## 2016-06-26 IMAGING — CR DG KNEE COMPLETE 4+V*L*
4 series · 4 of 4 positions shown · non-contrast
Comparison: None in PACs

CLINICAL DATA: Onset of left anterolateral knee pain yesterday
possibly related injury when putting down floor PADAM this past
weekend.

EXAM:
LEFT KNEE - COMPLETE 4+ VIEW

[knee ap]
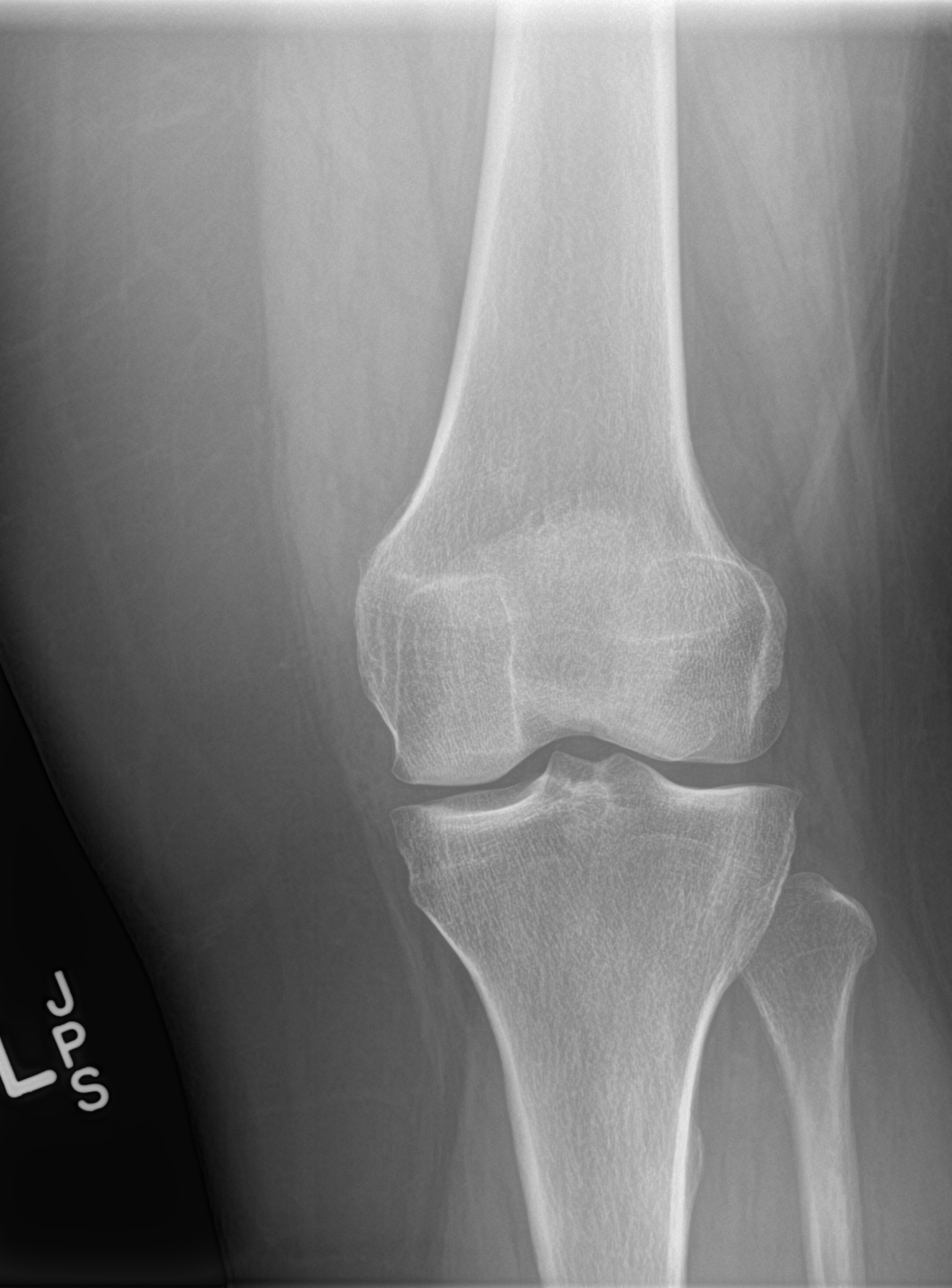

[knee lat]
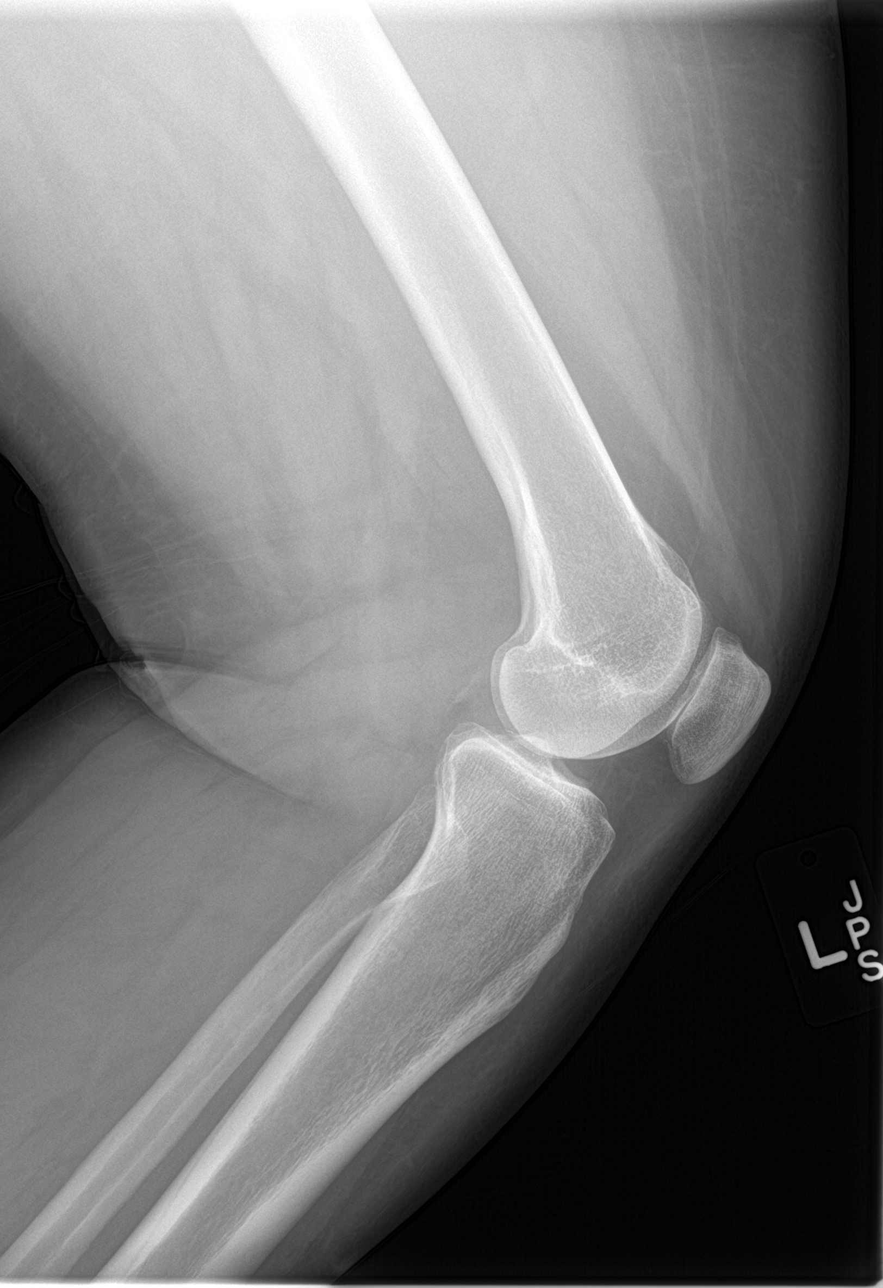

[knee obl (1 of 2)]
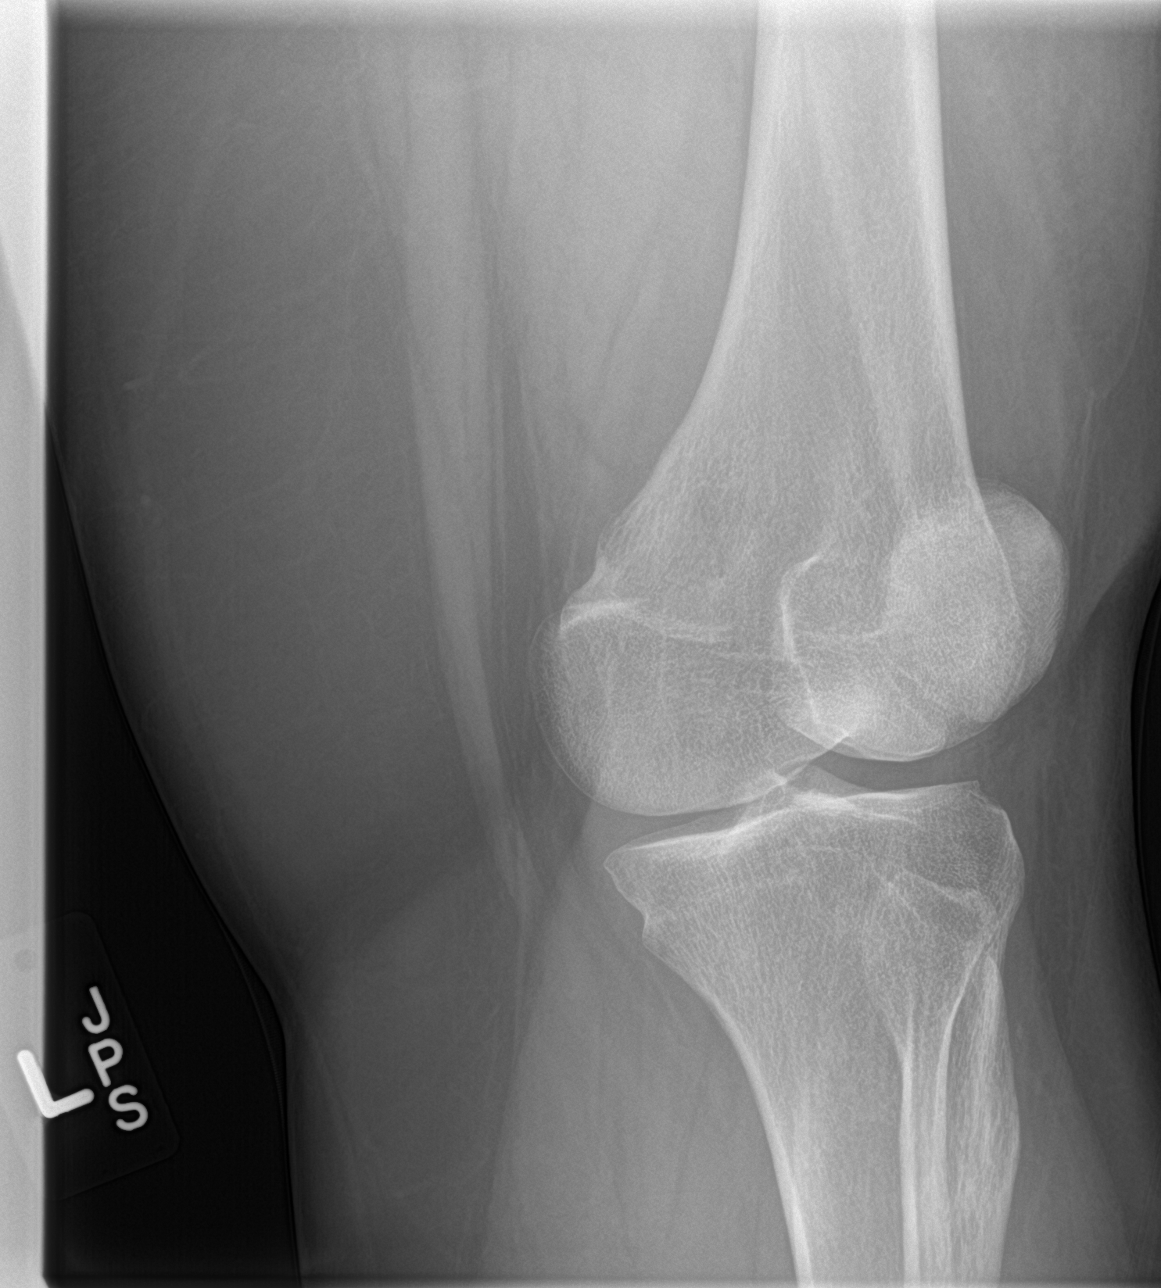

[knee obl (2 of 2)]
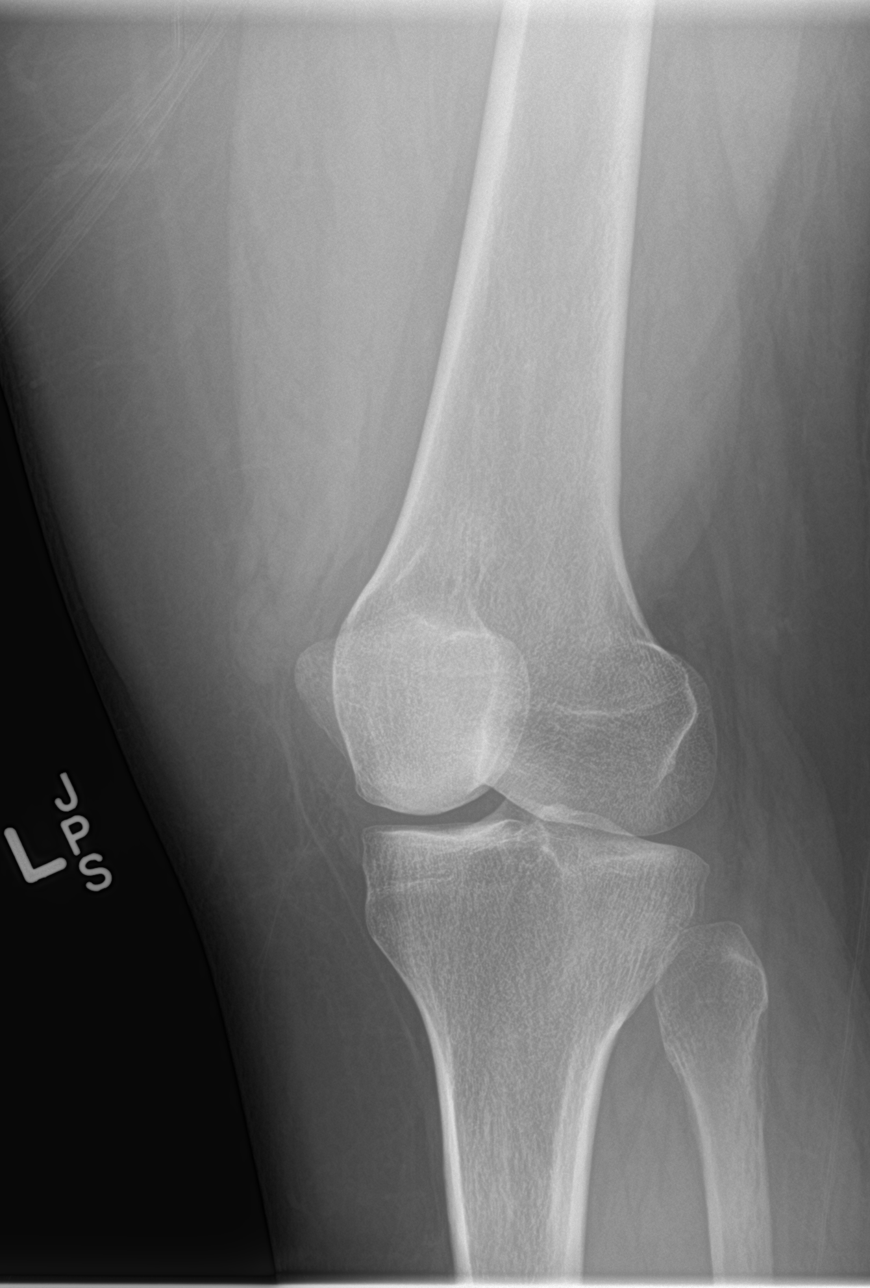

[4 of 4 positions shown; findings below may reference images not displayed]

FINDINGS: The bones are subjectively adequately mineralized. The joint spaces
are well maintained. There is no significant osteophyte formation.
There is no chondrocalcinosis. There is no acute fracture or
dislocation. There is no joint effusion.
IMPRESSION: There is no acute or chronic bony abnormality of the left knee.

## 2016-06-26 MED ORDER — HYDROCODONE-ACETAMINOPHEN 5-325 MG PO TABS: 1.0000 | ORAL_TABLET | Freq: Four times a day (QID) | ORAL | 0 refills | Status: DC | PRN

## 2016-06-26 MED ORDER — OXYCODONE-ACETAMINOPHEN 5-325 MG PO TABS
ORAL_TABLET | ORAL | Status: AC
  Administered 2016-06-26: 1 via ORAL
  Filled 2016-06-26: qty 1

## 2016-06-26 MED ORDER — DICLOFENAC SODIUM 75 MG PO TBEC: 75.0000 mg | DELAYED_RELEASE_TABLET | Freq: Two times a day (BID) | ORAL | 0 refills | Status: DC

## 2016-06-26 MED ORDER — OXYCODONE-ACETAMINOPHEN 5-325 MG PO TABS
1.0000 | ORAL_TABLET | ORAL | Status: DC | PRN
  Administered 2016-06-26: 1 via ORAL

## 2016-06-26 NOTE — Discharge Instructions (Signed)
Medications: Diclofenac, Norco  Treatment: Take diclofenac twice daily as prescribed. You can also take Tylenol as prescribed over-the-counter. For severe pain, take 1-2 Norco every 4-6 hours. Do not take Tylenol within 4 hours of taking this medicine. Do not drive or operate machinery when taking this medication and only take as prescribed. Use ice 3-4 times daily alternating 20 minutes on, 20 minutes off. Keep your leg elevated when you're not ambulating. Use knee sleeve at all times, especially when ambulating.  Follow-up: Please follow-up with orthopedics for further evaluation and treatment of your knee pain. Please return to the emergency department if you develop worsening symptoms.

## 2016-06-26 NOTE — ED Notes (Signed)
Ortho tech paged  

## 2016-06-26 NOTE — ED Provider Notes (Signed)
[facility] DEPT Provider Note   [name]: [id number] Arrival date & time: 06/26/16  [id number]  By signing my name below, I, Soijett Blue, attest that this documentation has been prepared under the direction and in the presence of Buel ReamAlexandra Inayah Woodin, PA-C Electronically Signed: Soijett Blue, ED Scribe. 06/26/16. 12:24 PM.  History   Chief Complaint Chief Complaint  Patient presents with  . Knee Pain    HPI Denise Collins is a 36 y.o. female with a PMHx of DM, thyroid disease, high cholesterol, who presents to the Emergency Department complaining of left knee pain onset yesterday. Pt reports associated gait problem due to pain. Pt has tried Rx 75 mg diclofenac with her last dose being at 7 AM this morning with no relief of her symptoms. 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 Pt notes that she noticed left knee pain upon getting up from a seated position while at work yesterday. She denies fever, chills, and any other symptoms. Pt notes that she does have a PCP at this time. Pt reports that she uses an insulin pump for her Type I DM. Denies hx of HTN.    The history is provided by the patient. No language interpreter was used.    Past Medical History:  Diagnosis Date  . Allergy   . Anemia   . Anxiety   . Asthma   . Depression   . Diabetes mellitus without complication (HCC)   . Migraine   . Thyroid disease     Patient Active Problem List   Diagnosis Date Noted  . Thoracic back sprain 06/04/2012    Past Surgical History:  Procedure Laterality Date  . ABDOMINAL HYSTERECTOMY    . LAPAROSCOPY ABDOMEN DIAGNOSTIC    . TONSILLECTOMY      OB History    No data available       Home Medications    Prior to Admission medications   Medication Sig Start Date End Date Taking? Authorizing Provider  albuterol (PROVENTIL HFA;VENTOLIN HFA) 108 (90 BASE) MCG/ACT inhaler Inhale 2 puffs into the lungs every 4 (four)  hours as needed for wheezing or shortness of breath.    Historical Provider, MD  ALPRAZolam (XANAX PO) Take by mouth.    Historical Provider, MD  ALPRAZolam Prudy Feeler(XANAX) 1 MG tablet Take 1 mg by mouth at bedtime.    Historical Provider, MD  azithromycin (ZITHROMAX) 250 MG tablet Take 250 mg by mouth. Take two tablets on first day then one tablet daily until complete.    Historical Provider, MD  butalbital-acetaminophen-caffeine (FIORICET, ESGIC) 50-325-40 MG per tablet Take 1 tablet by mouth every 4 (four) hours as needed for headache.    Historical Provider, MD  diclofenac (VOLTAREN) 75 MG EC tablet Take 1 tablet (75 mg total) by mouth 2 (two) times daily. 06/26/16   Mazen Marcin M Salisa Broz, PA-C  fluticasone (FLONASE) 50 MCG/ACT nasal spray Place 2 sprays into both nostrils 2 (two) times daily.    Historical Provider, MD  guaiFENesin (MUCINEX) 600 MG 12 hr tablet Take 1,200 mg by mouth 2 (two) times daily.    Historical Provider, MD  HYDROcodone-acetaminophen (NORCO/VICODIN) 5-325 MG tablet Take 1-2 tablets by mouth every 6 (six) hours as needed. 06/26/16   Emi HolesAlexandra M Delaynie Stetzer, PA-C  ibuprofen (ADVIL,MOTRIN) 200 MG tablet Take 200-400 mg by mouth every 6 (six) hours as needed for headache or moderate pain.    Historical Provider, MD  ibuprofen (ADVIL,MOTRIN) 600 MG tablet Take 1 tablet (600 mg total) by mouth every 6 (six) hours as needed. Patient not taking: Reported on 07/23/2014 04/14/13   Shon Baton, MD  Insulin Human (INSULIN PUMP) SOLN Inject into the skin. Pump in left arm--Humalog.    Historical Provider, MD  levothyroxine (SYNTHROID, LEVOTHROID) 175 MCG tablet Take 175 mcg by mouth at bedtime.     Historical Provider, MD  meloxicam (MOBIC) 15 MG tablet 1 pill daily for 1 week then as needed. Patient not taking: Reported on 07/23/2014 06/04/12   Judi Saa, DO  rosuvastatin (CRESTOR) 40 MG tablet Take 40 mg by mouth at bedtime.    Historical Provider, MD  sertraline (ZOLOFT) 100 MG tablet Take 2  tablets (200 mg total) by mouth daily. Patient not taking: Reported on 07/23/2014 06/17/12   Sherren Mocha, MD  tiZANidine (ZANAFLEX) 4 MG tablet Take 4 mg by mouth every 6 (six) hours as needed for muscle spasms.    Historical Provider, MD    Family History No family history on file.  Social History Social History  Substance Use Topics  . Smoking status: Former Smoker    Packs/day: 1.00    Years: 20.00    Types: Cigarettes  . Smokeless tobacco: Never Used  . Alcohol use Yes     Comment: weekly     Allergies   Imitrex [sumatriptan] and Triptans   Review of Systems Review of Systems  Constitutional: Negative for chills and fever.  Musculoskeletal: Positive for arthralgias (left knee) and gait problem (due to pain).     Physical Exam Updated Vital Signs BP 158/100 (BP Location: Left Arm)   Pulse 82   Temp 97.7 F (36.5 C) (Oral)   Resp 16   Ht 5' (1.524 m)   Wt 104.3 kg   SpO2 100%   BMI 44.92 kg/m   Physical Exam  Constitutional: She is oriented to person, place, and time. She appears well-developed and well-nourished. No distress.  HENT:  Head: Normocephalic and atraumatic.  Eyes: EOM are normal.  Neck: Neck supple.  Cardiovascular: Normal rate.   Pulmonary/Chest: Effort normal. No respiratory distress.  Abdominal: She exhibits no distension.  Musculoskeletal: Normal range of motion.       Left knee: She exhibits normal range of motion. Tenderness found.  Left knee with lateral inferior tenderness. Negative anterior/posterior drawer. Pain laterally with McMurray's test. FROM. Nl sensation. DP pulse intact.   Neurological: She is alert and oriented to person, place, and time.  Skin: Skin is warm and dry.  Psychiatric: She has a normal mood and affect. Her behavior is normal.  Nursing note and vitals reviewed.   ED Treatments / Results  DIAGNOSTIC STUDIES: Oxygen Saturation is 100% on RA, nl by my interpretation.    COORDINATION OF CARE: 12:04 PM Discussed  treatment plan with pt at bedside which includes left knee xray, referral and follow up with orthopedist, follow up with PCP, and pt agreed to plan.   Radiology Dg Knee Complete 4 Views Left  Result Date: 06/26/2016 CLINICAL DATA:  Onset of left anterolateral knee pain yesterday possibly related injury when putting down floor Ing this past weekend. EXAM: LEFT KNEE - COMPLETE 4+ VIEW COMPARISON:  None in PACs FINDINGS: The bones are subjectively adequately mineralized. The joint spaces are well maintained. There is no significant osteophyte formation. There is no chondrocalcinosis. There is no acute fracture or dislocation. There is no joint effusion. IMPRESSION: There is no acute  or chronic bony abnormality of the left knee. Electronically Signed   By: David  [place] M.D.   On: 06/26/2016 10:12    Procedures Procedures (including critical care time)  Medications Ordered in ED Medications  oxyCODONE-acetaminophen (PERCOCET/ROXICET) 5-325 MG per tablet 1 tablet (1 tablet Oral Given 06/26/16 1115)     Initial Impression / Assessment and Plan / ED Course  I have reviewed the triage vital signs and the nursing notes.  Pertinent imaging results that were available during my care of the patient were reviewed by me and considered in my medical decision making (see chart for details).      Patient X-Ray negative for obvious fracture or dislocation.  Pt advised to follow up with orthopedics. Patient given Ace wrapwhile in ED. Will be discharged home with short course Norco Rx, Diclofenac. Reviewed the [place] Narcotic Database and found no discrepancies. Conservative therapy recommended and discussed. Patient will be discharged home & is agreeable with above plan. Returns precautions discussed. Pt appears safe for discharge.  Final Clinical Impressions(s) / ED Diagnoses   Final diagnoses:  Acute pain of left knee    New Prescriptions New Prescriptions   DICLOFENAC (VOLTAREN) 75 MG EC TABLET     Take 1 tablet (75 mg total) by mouth 2 (two) times daily.   HYDROCODONE-ACETAMINOPHEN (NORCO/VICODIN) 5-325 MG TABLET    Take 1-2 tablets by mouth every 6 (six) hours as needed.   I personally performed the services described in this documentation, which was scribed in my presence. The recorded information has been reviewed and is accurate.     Emi Holes, PA-C 06/26/16 1313    Donnetta Hutching, MD 06/28/16 [phone]

## 2016-06-26 NOTE — Progress Notes (Signed)
Orthopedic Tech Progress Note Patient Details:  Denise Collins Feb 13, 1981 [id number]  Ortho Devices Type of Ortho Device: Ace wrap, Crutches Ortho Device/Splint Location: lle Ortho Device/Splint Interventions: Application   [name] 06/26/2016, 1:16 PM

## 2016-06-26 NOTE — ED Notes (Signed)
Pt stats sxhe understands instructions. Home stable with steady gait with crutches.

## 2016-06-26 NOTE — ED Notes (Signed)
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 C/O pain at left knee

## 2016-06-26 NOTE — Progress Notes (Signed)
Orthopedic Tech Progress Note Patient Details:  Denise Collins 05-Jan-1981 [id number]  Patient ID: Denise Collins, female   DOB: 05-Jan-1981, 36 y.o.   MRN: [id number]   Nikki DomCrawford, Neema Fluegge 06/26/2016, 1:17 PM Viewed order from doctor's order list

## 2016-06-26 NOTE — ED Triage Notes (Signed)
Onset one day ago developed left knee pain after getting up from a chair. Pain continued today currently 8/10 achy sore. Took pain medication prior to arrival.

## 2017-03-28 DIAGNOSIS — R0683 Snoring: Secondary | ICD-10-CM | POA: Insufficient documentation

## 2017-03-28 DIAGNOSIS — I10 Essential (primary) hypertension: Secondary | ICD-10-CM | POA: Insufficient documentation

## 2017-03-28 DIAGNOSIS — G4719 Other hypersomnia: Secondary | ICD-10-CM | POA: Insufficient documentation

## 2017-03-28 DIAGNOSIS — F331 Major depressive disorder, recurrent, moderate: Secondary | ICD-10-CM | POA: Insufficient documentation

## 2017-08-13 DIAGNOSIS — F17200 Nicotine dependence, unspecified, uncomplicated: Secondary | ICD-10-CM | POA: Insufficient documentation

## 2019-02-10 DIAGNOSIS — R635 Abnormal weight gain: Secondary | ICD-10-CM | POA: Insufficient documentation

## 2019-02-10 DIAGNOSIS — R5382 Chronic fatigue, unspecified: Secondary | ICD-10-CM | POA: Insufficient documentation

## 2019-05-17 ENCOUNTER — Ambulatory Visit
Admission: EM | Admit: 2019-05-17 | Discharge: 2019-05-17 | Disposition: A | Discharge status: Home / Self Care | Payer: 59

## 2019-05-17 ENCOUNTER — Encounter: Payer: Self-pay | Admitting: Emergency Medicine

## 2019-05-17 ENCOUNTER — Other Ambulatory Visit: Payer: Self-pay

## 2019-05-17 DIAGNOSIS — H60502 Unspecified acute noninfective otitis externa, left ear: Secondary | ICD-10-CM | POA: Diagnosis not present

## 2019-05-17 DIAGNOSIS — R0981 Nasal congestion: Secondary | ICD-10-CM | POA: Diagnosis not present

## 2019-05-17 DIAGNOSIS — Z20822 Contact with and (suspected) exposure to covid-19: Secondary | ICD-10-CM | POA: Diagnosis not present

## 2019-05-17 MED ORDER — NEOMYCIN-POLYMYXIN-HC 3.5-10000-1 OT SUSP: 4.0000 [drp] | Freq: Three times a day (TID) | OTIC | 0 refills | Status: DC

## 2019-05-17 MED ORDER — MELOXICAM 15 MG PO TABS: ORAL_TABLET | ORAL | 0 refills | Status: DC

## 2019-05-17 MED ORDER — OFLOXACIN 0.3 % OT SOLN: 3.0000 [drp] | Freq: Two times a day (BID) | OTIC | 0 refills | Status: AC

## 2019-05-17 NOTE — ED Provider Notes (Signed)
[name]    [name]: [id number] Arrival date & time: 05/17/19  1150      History   Chief Complaint Chief Complaint  Patient presents with  . Otalgia    HPI Denise Collins is a 39 y.o. female with history of diabetes, thyroid disease, asthma, allergies   Presenting for Covid testing: Exposure: grandparents who tested positive on 12/24 Date of exposure: 12/19 Any fever, symptoms since exposure: yes - left ear pain, nasal congestion, nonproductive cough.  Patient does smoke: States cough is near baseline.  Denies difficulty breathing, chest pain.  Ear pain began 12/26.  Tried Tylenol without significant relief.   Past Medical History:  Diagnosis Date  . Allergy   . Anemia   . Anxiety   . Asthma   . Depression   . Diabetes mellitus without complication (HCC)   . Migraine   . Thyroid disease     Patient Active Problem List   Diagnosis Date Noted  . Thoracic back sprain 06/04/2012    Past Surgical History:  Procedure Laterality Date  . ABDOMINAL HYSTERECTOMY    . LAPAROSCOPY ABDOMEN DIAGNOSTIC    . TONSILLECTOMY      OB History   No obstetric history on file.      Home Medications    Prior to Admission medications   Medication Sig Start Date End Date Taking? Authorizing Provider  ARIPiprazole (ABILIFY) 10 MG tablet Take 10 mg by mouth daily.   Yes [provider]  insulin aspart (NOVOLOG) 100 UNIT/ML injection Inject into the skin 3 (three) times daily before meals.   Yes [provider]  venlafaxine (EFFEXOR) 100 MG tablet Take 100 mg by mouth 2 (two) times daily.   Yes [provider]  albuterol (PROVENTIL HFA;VENTOLIN HFA) 108 (90 BASE) MCG/ACT inhaler Inhale 2 puffs into the lungs every 4 (four) hours as needed for wheezing or shortness of breath.    [provider]  ALPRAZolam (XANAX PO) Take by mouth.    [provider]  ALPRAZolam Prudy Feeler) 1 MG tablet Take 1 mg by mouth at bedtime.    [provider]  azithromycin (ZITHROMAX) 250 MG tablet Take 250 mg by mouth. Take two tablets on first day then one tablet daily until complete.    [provider]  butalbital-acetaminophen-caffeine (FIORICET, ESGIC) 50-325-40 MG per tablet Take 1 tablet by mouth every 4 (four) hours as needed for headache.    [provider]  diclofenac (VOLTAREN) 75 MG EC tablet Take 1 tablet (75 mg total) by mouth 2 (two) times daily. 06/26/16   Law, Waylan Boga, PA-C  fluticasone (FLONASE) 50 MCG/ACT nasal spray Place 2 sprays into both nostrils 2 (two) times daily.    [provider]  guaiFENesin (MUCINEX) 600 MG 12 hr tablet Take 1,200 mg by mouth 2 (two) times daily.    [provider]  HYDROcodone-acetaminophen (NORCO/VICODIN) 5-325 MG tablet Take 1-2 tablets by mouth every 6 (six) hours as needed. 06/26/16   Law, Waylan Boga, PA-C  ibuprofen (ADVIL,MOTRIN) 200 MG tablet Take 200-400 mg by mouth every 6 (six) hours as needed for headache or moderate pain.    [provider]  ibuprofen (ADVIL,MOTRIN) 600 MG tablet Take 1 tablet (600 mg total) by mouth every 6 (six) hours as needed. Patient not taking: Reported on 07/23/2014 04/14/13   Horton, Mayer Masker, MD  Insulin Human (INSULIN PUMP) SOLN Inject into the skin. Pump in left arm--Humalog.    [provider]  levothyroxine (SYNTHROID, LEVOTHROID) 175 MCG tablet Take 175 mcg by mouth at bedtime.     [provider]  meloxicam (MOBIC) 15 MG tablet 1 pill daily for 1 week then as needed. 05/17/19   Hall-Potvin, [place], PA-C  neomycin-polymyxin-hydrocortisone (CORTISPORIN) 3.5-10000-1 OTIC suspension Place 4 drops into the left ear 3 (three) times daily. 05/17/19   Hall-Potvin, [place], PA-C  ofloxacin (FLOXIN) 0.3 % OTIC solution Place 3 drops into the left ear 2 (two) times daily for 7 days. 05/17/19 05/24/19  Hall-Potvin, [place], PA-C  rosuvastatin (CRESTOR) 40 MG tablet Take 40 mg by mouth at bedtime.     [provider]  sertraline (ZOLOFT) 100 MG tablet Take 2 tablets (200 mg total) by mouth daily. Patient not taking: Reported on 07/23/2014 06/17/12   Shawnee Knapp, MD  tiZANidine (ZANAFLEX) 4 MG tablet Take 4 mg by mouth every 6 (six) hours as needed for muscle spasms.    [provider]    Family History Family History  Problem Relation Age of Onset  . Cancer Mother   . Heart disease Father   . Hypertension Father   . Diabetes Father     Social History Social History   Tobacco Use  . Smoking status: Current Every Day Smoker    Packs/day: 1.00    Years: 20.00    Pack years: 20.00    Types: Cigarettes  . Smokeless tobacco: Never Used  Substance Use Topics  . Alcohol use: Yes    Comment: weekly  . Drug use: No     Allergies   Imitrex [sumatriptan] and Triptans   Review of Systems As per HPI   Physical Exam Triage Vital Signs ED Triage Vitals  Enc Vitals Group     BP      Pulse      Resp      Temp      Temp src      SpO2      Weight      Height      Head Circumference      Peak Flow      Pain Score      Pain Loc      Pain Edu?      Excl. in [place]?    No data found.  Updated Vital Signs BP 136/80 (BP Location: Left Arm)   Pulse 77   Temp 97.8 F (36.6 C) (Oral)   Resp 16   SpO2 95%   Visual Acuity Right Eye Distance:   Left Eye Distance:   Bilateral Distance:    Right Eye Near:   Left Eye Near:    Bilateral Near:     Physical Exam Constitutional:      General: She is not in acute distress.    Appearance: She is obese. She is not toxic-appearing or diaphoretic.  HENT:     Head: Normocephalic and atraumatic.     Jaw: There is normal jaw occlusion. No tenderness or pain on movement.     Right Ear: Hearing, tympanic membrane, ear canal and external ear normal. No tenderness. No mastoid tenderness.     Left Ear: Hearing and tympanic membrane normal. No tenderness. No mastoid tenderness.     Ears:     Comments: Left ear with  tragal tenderness, EAC swelling, moderate amount of curd-like discharge.  No blood.    Nose: Rhinorrhea present. No nasal deformity, septal deviation or nasal tenderness.     Right Turbinates: Not swollen  or pale.     Left Turbinates: Not swollen or pale.     Right Sinus: No maxillary sinus tenderness or frontal sinus tenderness.     Left Sinus: No maxillary sinus tenderness or frontal sinus tenderness.     Comments: Negative sinus tenderness    Mouth/Throat:     Lips: Pink. No lesions.     Mouth: Mucous membranes are moist. No injury.     Pharynx: Oropharynx is clear. Uvula midline. No posterior oropharyngeal erythema or uvula swelling.     Comments: no tonsillar exudate or hypertrophy Eyes:     General: No scleral icterus.    Conjunctiva/sclera: Conjunctivae normal.     Pupils: Pupils are equal, round, and reactive to light.  Cardiovascular:     Rate and Rhythm: Normal rate.  Pulmonary:     Effort: Pulmonary effort is normal. No respiratory distress.     Breath sounds: No wheezing.  Musculoskeletal:     Cervical back: Normal range of motion and neck supple. No tenderness. No muscular tenderness.  Lymphadenopathy:     Cervical: No cervical adenopathy.  Neurological:     Mental Status: She is alert and oriented to person, place, and time.      UC Treatments / Results  Labs (all labs ordered are listed, but only abnormal results are displayed) Labs Reviewed  NOVEL CORONAVIRUS, NAA    EKG   Radiology No results found.  Procedures Procedures (including critical care time)  Medications Ordered in UC Medications - No data to display  Initial Impression / Assessment and Plan / UC Course  I have reviewed the triage vital signs and the nursing notes.  Pertinent labs & imaging results that were available during my care of the patient were reviewed by me and considered in my medical decision making (see chart for details).     Patient afebrile, nontoxic, with SpO2 96%  at bedside.  Covid PCR pending.  Patient to quarantine until results are back.  We will continue supportive management and add ofloxacin and Cortisporin eardrops for otitis externa of left ear.  Low concern for malignant otitis externa at this time.  Return precautions discussed, patient verbalized understanding and is agreeable to plan. Final Clinical Impressions(s) / UC Diagnoses   Final diagnoses:  Nasal congestion  Exposure to COVID-19 virus  Acute otitis externa of left ear, unspecified type     Discharge Instructions     Use eardrops / Take antibiotic as prescribed for the next week. Return for worsening ear pain, swelling, discharge, bleeding, decreased hearing, development of jaw pain/swelling, fever.  Do NOT use Q-tips as these can cause your ear wax to get stuck, the tips may break off and become a foreign body requiring additional medical care, or puncture your eardrum.  Helpful prevention tip: Use a solution of equal parts isopropyl (rubbing) alcohol and white vinegar (acetic acid) in both ears after swimming.    ED Prescriptions    Medication Sig Dispense Auth. Provider   meloxicam (MOBIC) 15 MG tablet 1 pill daily for 1 week then as needed. 14 tablet Hall-Potvin, [place], PA-C   neomycin-polymyxin-hydrocortisone (CORTISPORIN) 3.5-10000-1 OTIC suspension Place 4 drops into the left ear 3 (three) times daily. 10 mL Hall-Potvin, [place], PA-C   ofloxacin (FLOXIN) 0.3 % OTIC solution Place 3 drops into the left ear 2 (two) times daily for 7 days. 10 mL Hall-Potvin, [place], PA-C     I have reviewed the PDMP during this encounter.   Hall-Potvin, [place], [place]  05/17/19 1303  

## 2019-05-17 NOTE — ED Triage Notes (Signed)
Pt presents to [facility] for assessment of ear pain since 12/26.  C/o nasal congestion and cough as well.  States ibuprofen helps the pain improve, but it comes back.  Tried OTC ear pain drops as well.

## 2019-05-17 NOTE — Discharge Instructions (Signed)
Use eardrops / Take antibiotic as prescribed for the next week. Return for worsening ear pain, swelling, discharge, bleeding, decreased hearing, development of jaw pain/swelling, fever.  Do NOT use Q-tips as these can cause your ear wax to get stuck, the tips may break off and become a foreign body requiring additional medical care, or puncture your eardrum.  Helpful prevention tip: Use a solution of equal parts isopropyl (rubbing) alcohol and white vinegar (acetic acid) in both ears after swimming. 

## 2019-05-18 LAB — NOVEL CORONAVIRUS, NAA: SARS-CoV-2, NAA: NOT DETECTED

## 2019-05-20 ENCOUNTER — Other Ambulatory Visit: Payer: Self-pay

## 2019-05-20 ENCOUNTER — Encounter: Payer: Self-pay | Admitting: Emergency Medicine

## 2019-05-20 ENCOUNTER — Ambulatory Visit
Admission: EM | Admit: 2019-05-20 | Discharge: 2019-05-20 | Disposition: A | Discharge status: Home / Self Care | Payer: 59 | Attending: Emergency Medicine | Admitting: Emergency Medicine

## 2019-05-20 DIAGNOSIS — H66006 Acute suppurative otitis media without spontaneous rupture of ear drum, recurrent, bilateral: Secondary | ICD-10-CM | POA: Diagnosis not present

## 2019-05-20 DIAGNOSIS — F17200 Nicotine dependence, unspecified, uncomplicated: Secondary | ICD-10-CM

## 2019-05-20 DIAGNOSIS — J4521 Mild intermittent asthma with (acute) exacerbation: Secondary | ICD-10-CM

## 2019-05-20 MED ORDER — AMOXICILLIN-POT CLAVULANATE 875-125 MG PO TABS: 1.0000 | ORAL_TABLET | Freq: Two times a day (BID) | ORAL | 0 refills | Status: DC

## 2019-05-20 MED ORDER — PREDNISONE 20 MG PO TABS: 20.0000 mg | ORAL_TABLET | Freq: Every day | ORAL | 0 refills | Status: DC

## 2019-05-20 MED ORDER — AEROCHAMBER PLUS FLO-VU MEDIUM MISC: 1.0000 | Freq: Once | 0 refills | Status: AC

## 2019-05-20 MED ORDER — FLUCONAZOLE 200 MG PO TABS: 200.0000 mg | ORAL_TABLET | Freq: Once | ORAL | 0 refills | Status: AC

## 2019-05-20 MED ORDER — ALBUTEROL SULFATE HFA 108 (90 BASE) MCG/ACT IN AERS: 2.0000 | INHALATION_SPRAY | RESPIRATORY_TRACT | 0 refills | Status: DC | PRN

## 2019-05-20 NOTE — ED Triage Notes (Signed)
Pt presents for continued ear pain, facial pain, nasal congestion since her last visit.  States last night she became SOB while trying to lay down on the couch, and she took a nebulizer treatment and inhaler  Patient states she is still having some SOB, 95% on RA at triage, NAD.

## 2019-05-20 NOTE — Discharge Instructions (Addendum)
Take antibiotic with food. Use spacer with inhaler no more than every 4 hours as needed. Follow up with PCP if no improvement in hearing after antibiotic use.

## 2019-05-20 NOTE — ED Provider Notes (Signed)
[name]    [name]: [id number] Arrival date & time: 05/20/19  1111      History   Chief Complaint Chief Complaint  Patient presents with  . URI    HPI Denise Collins is a 39 y.o. female with history of diabetes, asthma, allergies, thyroid disease presenting for continued ear pain since last visit.  Patient previously evaluated by me on 05/17/2019: Patient treated for left otitis externa with ofloxacin, Cortisporin.  Patient states she is been compliant with eardrops, though develop worsening pain.  Denies change in hearing, ear discharge.  She had Covid testing done at 1/3 of visit due to URI symptoms: Negative.  Patient states she had some shortness of breath the other night when laying down on the couch.  Ended up using her daughter's nebulizer with improved breathing.  Denies fever, chest pain, cough.  Patient states this feels similar to previous asthma exacerbations.   Past Medical History:  Diagnosis Date  . Allergy   . Anemia   . Anxiety   . Asthma   . Depression   . Diabetes mellitus without complication ([place])   . Migraine   . Thyroid disease     Patient Active Problem List   Diagnosis Date Noted  . Thoracic back sprain 06/04/2012    Past Surgical History:  Procedure Laterality Date  . ABDOMINAL HYSTERECTOMY    . LAPAROSCOPY ABDOMEN DIAGNOSTIC    . TONSILLECTOMY      OB History   No obstetric history on file.      Home Medications    Prior to Admission medications   Medication Sig Start Date End Date Taking? Authorizing Provider  albuterol (VENTOLIN HFA) 108 (90 Base) MCG/ACT inhaler Inhale 2 puffs into the lungs every 4 (four) hours as needed for wheezing or shortness of breath. 05/20/19   Hall-Potvin, [place], PA-C  ALPRAZolam (XANAX PO) Take by mouth.    [provider]  ALPRAZolam Duanne Moron) 1 MG tablet Take 1 mg by mouth at bedtime.    [provider]  amoxicillin-clavulanate (AUGMENTIN) 875-125 MG tablet Take 1 tablet by  mouth every 12 (twelve) hours. 05/20/19   Hall-Potvin, [place], PA-C  ARIPiprazole (ABILIFY) 10 MG tablet Take 10 mg by mouth daily.    [provider]  butalbital-acetaminophen-caffeine (FIORICET, ESGIC) 50-325-40 MG per tablet Take 1 tablet by mouth every 4 (four) hours as needed for headache.    [provider]  diclofenac (VOLTAREN) 75 MG EC tablet Take 1 tablet (75 mg total) by mouth 2 (two) times daily. 06/26/16   Law, Bea Graff, PA-C  fluticasone (FLONASE) 50 MCG/ACT nasal spray Place 2 sprays into both nostrils 2 (two) times daily.    [provider]  guaiFENesin (MUCINEX) 600 MG 12 hr tablet Take 1,200 mg by mouth 2 (two) times daily.    [provider]  HYDROcodone-acetaminophen (NORCO/VICODIN) 5-325 MG tablet Take 1-2 tablets by mouth every 6 (six) hours as needed. 06/26/16   Law, Bea Graff, PA-C  ibuprofen (ADVIL,MOTRIN) 200 MG tablet Take 200-400 mg by mouth every 6 (six) hours as needed for headache or moderate pain.    [provider]  ibuprofen (ADVIL,MOTRIN) 600 MG tablet Take 1 tablet (600 mg total) by mouth every 6 (six) hours as needed. Patient not taking: Reported on 07/23/2014 04/14/13   Horton, Barbette Hair, MD  insulin aspart (NOVOLOG) 100 UNIT/ML injection Inject into the skin 3 (three) times daily before meals.    [provider]  Insulin  Human (INSULIN PUMP) SOLN Inject into the skin. Pump in left arm--Humalog.    [provider]  levothyroxine (SYNTHROID, LEVOTHROID) 175 MCG tablet Take 175 mcg by mouth at bedtime.     [provider]  meloxicam (MOBIC) 15 MG tablet 1 pill daily for 1 week then as needed. 05/17/19   Hall-Potvin, [place], PA-C  neomycin-polymyxin-hydrocortisone (CORTISPORIN) 3.5-10000-1 OTIC suspension Place 4 drops into the left ear 3 (three) times daily. 05/17/19   Hall-Potvin, [place], PA-C  ofloxacin (FLOXIN) 0.3 % OTIC solution Place 3 drops into the left ear 2 (two) times daily for 7  days. 05/17/19 05/24/19  Hall-Potvin, [place], PA-C  predniSONE (DELTASONE) 20 MG tablet Take 1 tablet (20 mg total) by mouth daily. 05/20/19   Hall-Potvin, [place], PA-C  rosuvastatin (CRESTOR) 40 MG tablet Take 40 mg by mouth at bedtime.    [provider]  sertraline (ZOLOFT) 100 MG tablet Take 2 tablets (200 mg total) by mouth daily. Patient not taking: Reported on 07/23/2014 06/17/12   Sherren Mocha, MD  tiZANidine (ZANAFLEX) 4 MG tablet Take 4 mg by mouth every 6 (six) hours as needed for muscle spasms.    [provider]  venlafaxine (EFFEXOR) 100 MG tablet Take 100 mg by mouth 2 (two) times daily.    [provider]    Family History Family History  Problem Relation Age of Onset  . Cancer Mother   . Heart disease Father   . Hypertension Father   . Diabetes Father     Social History Social History   Tobacco Use  . Smoking status: Current Every Day Smoker    Packs/day: 1.00    Years: 20.00    Pack years: 20.00    Types: Cigarettes  . Smokeless tobacco: Never Used  Substance Use Topics  . Alcohol use: Yes    Comment: weekly  . Drug use: No     Allergies   Imitrex [sumatriptan] and Triptans   Review of Systems As per HPI  Physical Exam Triage Vital Signs ED Triage Vitals  Enc Vitals Group     BP 05/20/19 1124 (!) 143/90     Pulse Rate 05/20/19 1124 80     Resp 05/20/19 1124 16     Temp 05/20/19 1124 98.1 F (36.7 C)     Temp Source 05/20/19 1124 Oral     SpO2 05/20/19 1124 95 %     Weight --      Height --      Head Circumference --      Peak Flow --      Pain Score 05/20/19 1131 6     Pain Loc --      Pain Edu? --      Excl. in GC? --    No data found.  Updated Vital Signs BP (!) 143/90 (BP Location: Left Arm)   Pulse 80   Temp 98.1 F (36.7 C) (Oral)   Resp 16   SpO2 95%   Visual Acuity Right Eye Distance:   Left Eye Distance:   Bilateral Distance:    Right Eye Near:   Left Eye Near:    Bilateral Near:      Physical Exam Constitutional:      General: She is not in acute distress. HENT:     Head: Normocephalic and atraumatic.     Right Ear: Ear canal and external ear normal.     Left Ear: Ear canal and external ear normal.  Ears:     Comments: EACs are improved as compared to previous assessment.  Patient does have bilateral suppurativa inferior to TMs.  No TM bulging, perforation.    Mouth/Throat:     Mouth: Mucous membranes are moist.     Pharynx: Oropharynx is clear.  Eyes:     General: No scleral icterus.    Pupils: Pupils are equal, round, and reactive to light.  Cardiovascular:     Rate and Rhythm: Normal rate and regular rhythm.  Pulmonary:     Effort: Pulmonary effort is normal. No respiratory distress.     Breath sounds: No stridor. Wheezing present. No rhonchi or rales.     Comments: SPO2 95-97% at bedside on room air Musculoskeletal:        General: Normal range of motion.     Right lower leg: No edema.     Left lower leg: No edema.  Skin:    Capillary Refill: Capillary refill takes less than 2 seconds.     Coloration: Skin is not jaundiced or pale.     Findings: No rash.  Neurological:     Mental Status: She is alert and oriented to person, place, and time.      UC Treatments / Results  Labs (all labs ordered are listed, but only abnormal results are displayed) Labs Reviewed - No data to display  EKG   Radiology No results found.  Procedures Procedures (including critical care time)  Medications Ordered in UC Medications - No data to display  Initial Impression / Assessment and Plan / UC Course  I have reviewed the triage vital signs and the nursing notes.  Pertinent labs & imaging results that were available during my care of the patient were reviewed by me and considered in my medical decision making (see chart for details).     Patient afebrile, nontoxic.  SPO2 95-97% at bedside.  Lateral ear canals look much improved compared to previous  assessment, though patient has now developed bilateral otitis media with suppurativa: We will start Augmentin.  For breathing, will start albuterol with spacing device, prednisone.  Patient endorsing yeast infections status post antibiotic use: Diflucan sent.  Return precautions discussed, patient verbalized understanding and is agreeable to plan. Final Clinical Impressions(s) / UC Diagnoses   Final diagnoses:  Recurrent acute suppurative otitis media without spontaneous rupture of tympanic membrane of both sides  Mild intermittent asthma with acute exacerbation     Discharge Instructions     Take antibiotic with food. Use spacer with inhaler no more than every 4 hours as needed. Follow up with PCP if no improvement in hearing after antibiotic use.    ED Prescriptions    Medication Sig Dispense Auth. Provider   amoxicillin-clavulanate (AUGMENTIN) 875-125 MG tablet Take 1 tablet by mouth every 12 (twelve) hours. 14 tablet Hall-Potvin, [place], PA-C   albuterol (VENTOLIN HFA) 108 (90 Base) MCG/ACT inhaler Inhale 2 puffs into the lungs every 4 (four) hours as needed for wheezing or shortness of breath. 18 g Hall-Potvin, [place], PA-C   Spacer/Aero-Holding Chambers (AEROCHAMBER PLUS FLO-VU MEDIUM) MISC 1 each by Other route once for 1 dose. 1 each Hall-Potvin, [place], PA-C   predniSONE (DELTASONE) 20 MG tablet Take 1 tablet (20 mg total) by mouth daily. 5 tablet Hall-Potvin, [place], PA-C   fluconazole (DIFLUCAN) 200 MG tablet Take 1 tablet (200 mg total) by mouth once for 1 dose. May repeat in 72 hours if needed 2 tablet Hall-Potvin, [place], [place]  PDMP not reviewed this encounter.   Hall-Potvin, [place], [place] 05/21/19 1102

## 2019-05-24 ENCOUNTER — Emergency Department (HOSPITAL_COMMUNITY)
Admission: EM | Admit: 2019-05-24 | Discharge: 2019-05-25 | Disposition: A | Discharge status: Home / Self Care | Payer: 59 | Attending: Emergency Medicine | Admitting: Emergency Medicine

## 2019-05-24 ENCOUNTER — Encounter (HOSPITAL_COMMUNITY): Payer: Self-pay | Admitting: Emergency Medicine

## 2019-05-24 ENCOUNTER — Emergency Department (HOSPITAL_COMMUNITY): Payer: 59

## 2019-05-24 ENCOUNTER — Other Ambulatory Visit: Payer: Self-pay

## 2019-05-24 DIAGNOSIS — F17210 Nicotine dependence, cigarettes, uncomplicated: Secondary | ICD-10-CM | POA: Diagnosis not present

## 2019-05-24 DIAGNOSIS — J45909 Unspecified asthma, uncomplicated: Secondary | ICD-10-CM | POA: Insufficient documentation

## 2019-05-24 DIAGNOSIS — E119 Type 2 diabetes mellitus without complications: Secondary | ICD-10-CM | POA: Diagnosis not present

## 2019-05-24 DIAGNOSIS — Z794 Long term (current) use of insulin: Secondary | ICD-10-CM | POA: Diagnosis not present

## 2019-05-24 DIAGNOSIS — R05 Cough: Secondary | ICD-10-CM | POA: Insufficient documentation

## 2019-05-24 DIAGNOSIS — Z79899 Other long term (current) drug therapy: Secondary | ICD-10-CM | POA: Diagnosis not present

## 2019-05-24 DIAGNOSIS — R059 Cough, unspecified: Secondary | ICD-10-CM

## 2019-05-24 DIAGNOSIS — R0602 Shortness of breath: Secondary | ICD-10-CM | POA: Diagnosis present

## 2019-05-24 LAB — BASIC METABOLIC PANEL
Anion gap: 14 (ref 5–15)
BUN: 22 mg/dL — ABNORMAL HIGH (ref 6–20)
CO2: 20 mmol/L — ABNORMAL LOW (ref 22–32)
Calcium: 9.4 mg/dL (ref 8.9–10.3)
Chloride: 99 mmol/L (ref 98–111)
Creatinine, Ser: 0.87 mg/dL (ref 0.44–1.00)
GFR calc Af Amer: >60 mL/min (ref >60)
GFR calc non Af Amer: >60 mL/min (ref >60)
Glucose, Bld: 393 mg/dL — ABNORMAL HIGH (ref 70–99)
Potassium: 4.5 mmol/L (ref 3.5–5.1)
Sodium: 133 mmol/L — ABNORMAL LOW (ref 135–145)

## 2019-05-24 LAB — CBC WITH DIFFERENTIAL/PLATELET
Abs Immature Granulocytes: 0.17 10*3/uL — ABNORMAL HIGH (ref 0.00–0.07)
Basophils Absolute: 0 10*3/uL (ref 0.0–0.1)
Basophils Relative: 0 %
Eosinophils Absolute: 0.1 10*3/uL (ref 0.0–0.5)
Eosinophils Relative: 0 %
HCT: 39.3 % (ref 36.0–46.0)
Hemoglobin: 12.4 g/dL (ref 12.0–15.0)
Immature Granulocytes: 1 %
Lymphocytes Relative: 10 %
Lymphs Abs: 1.3 10*3/uL (ref 0.7–4.0)
MCH: 30.5 pg (ref 26.0–34.0)
MCHC: 31.6 g/dL (ref 30.0–36.0)
MCV: 96.8 fL (ref 80.0–100.0)
Monocytes Absolute: 0.6 10*3/uL (ref 0.1–1.0)
Monocytes Relative: 4 %
Neutro Abs: 11.6 10*3/uL — ABNORMAL HIGH (ref 1.7–7.7)
Neutrophils Relative %: 85 %
Platelets: 338 10*3/uL (ref 150–400)
RBC: 4.06 MIL/uL (ref 3.87–5.11)
RDW: 13.9 % (ref 11.5–15.5)
WBC: 13.8 10*3/uL — ABNORMAL HIGH (ref 4.0–10.5)
nRBC: 0.1 % (ref 0.0–0.2)

## 2019-05-24 IMAGING — DX DG CHEST 1V PORT
1 series · 1 of 1 positions shown · non-contrast
Comparison: Chest radiographs [DATE] and earlier.

CLINICAL DATA: 38-year-old female with shortness of breath and
nonproductive cough for 2 weeks.

EXAM:
PORTABLE CHEST 1 VIEW

[chest]
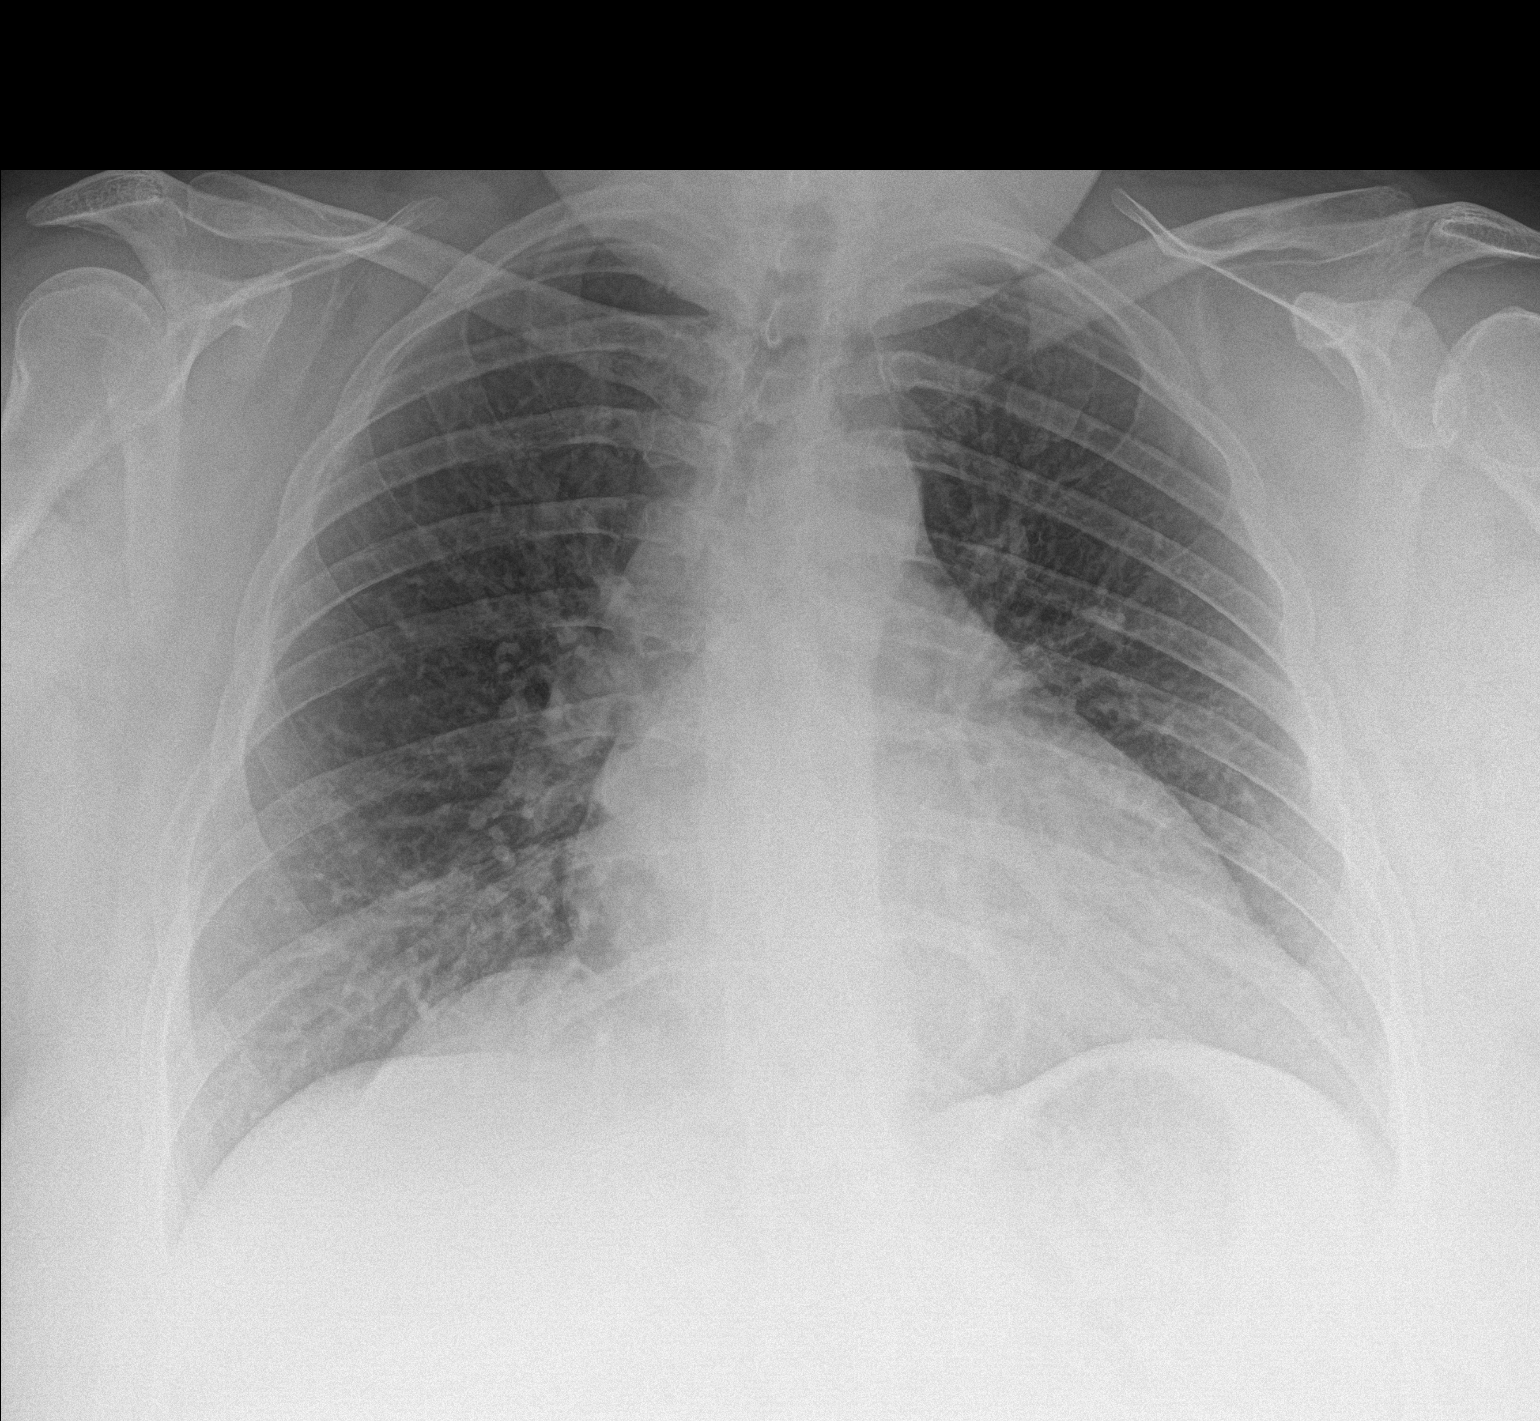

[1 of 1 positions shown; findings below may reference images not displayed]

FINDINGS: Portable AP upright view at [XR] hours. Lung volumes and mediastinal
contours remain within normal limits. Eventration of the right
hemidiaphragm again noted (normal variant). Visualized tracheal air
column is within normal limits. Allowing for portable technique the
lungs are clear. Negative visible bowel gas pattern and osseous
structures.
IMPRESSION: Negative portable chest.

## 2019-05-24 NOTE — ED Triage Notes (Signed)
Patient reports SOB with intermittent dry cough for 2 weeks , denies fever or chills .

## 2019-05-25 ENCOUNTER — Encounter (HOSPITAL_COMMUNITY): Payer: Self-pay | Admitting: Emergency Medicine

## 2019-05-25 MED ORDER — GUAIFENESIN ER 600 MG PO TB12: 600.0000 mg | ORAL_TABLET | Freq: Two times a day (BID) | ORAL | 0 refills | Status: DC

## 2019-05-25 MED ORDER — ACETAMINOPHEN 500 MG PO TABS
1000.0000 mg | ORAL_TABLET | Freq: Once | ORAL | Status: AC
Start: 2019-05-25 — End: 2019-05-25
  Administered 2019-05-25: 02:00:00 1000 mg via ORAL
  Filled 2019-05-25: qty 2

## 2019-05-25 MED ORDER — GUAIFENESIN ER 600 MG PO TB12
1200.0000 mg | ORAL_TABLET | Freq: Two times a day (BID) | ORAL | Status: DC
  Administered 2019-05-25: 1200 mg via ORAL
  Filled 2019-05-25: qty 2

## 2019-05-25 NOTE — ED Provider Notes (Addendum)
[facility] DEPART[facility] Provider Note   [name]: [id number] Arrival date & time: 05/24/19  1954     History Chief Complaint  Patient presents with  . Shortness of Breath    Cough    Denise Collins is a 39 y.o. female.  The history is provided by the patient.  Shortness of Breath Severity:  Moderate Onset quality:  Gradual Duration:  6 days Timing:  Sporadic Progression:  Unchanged Chronicity:  New Context: URI   Context: not activity, not animal exposure, not emotional upset, not fumes, not known allergens, not occupational exposure, not pollens, not smoke exposure, not strong odors and not weather changes   Relieved by:  Nothing Worsened by:  Nothing Ineffective treatments:  Inhaler (inhaler, antibiotic, steroids, mobic) Associated symptoms: cough   Associated symptoms: no abdominal pain, no chest pain, no diaphoresis, no ear pain, no fever, no headaches, no hemoptysis, no neck pain, no PND, no sore throat, no syncope, no swollen glands and no vomiting   Associated symptoms comment:  Ear pain for which she is on antibiotic drops Risk factors: no recent alcohol use, no hx of PE/DVT and no prolonged immobilization   Patient with anxiety and asthma who has been seen for ear pain and cough in the past week presents with same.      Past Medical History:  Diagnosis Date  . Allergy   . Anemia   . Anxiety   . Asthma   . Depression   . Diabetes mellitus without complication ([place])   . Migraine   . Thyroid disease     Patient Active Problem List   Diagnosis Date Noted  . Thoracic back sprain 06/04/2012    Past Surgical History:  Procedure Laterality Date  . ABDOMINAL HYSTERECTOMY    . LAPAROSCOPY ABDOMEN DIAGNOSTIC    . TONSILLECTOMY       OB History   No obstetric history on file.     Family History  Problem Relation Age of Onset  . Cancer Mother   . Heart disease Father   . Hypertension Father   . Diabetes Father     Social  History   Tobacco Use  . Smoking status: Current Every Day Smoker    Packs/day: 1.00    Years: 20.00    Pack years: 20.00    Types: Cigarettes  . Smokeless tobacco: Never Used  Substance Use Topics  . Alcohol use: Yes    Comment: weekly  . Drug use: No    Home Medications Prior to Admission medications   Medication Sig Start Date End Date Taking? Authorizing Provider  albuterol (VENTOLIN HFA) 108 (90 Base) MCG/ACT inhaler Inhale 2 puffs into the lungs every 4 (four) hours as needed for wheezing or shortness of breath. 05/20/19   Hall-Potvin, [place], PA-C  ALPRAZolam (XANAX PO) Take by mouth.    [provider]  ALPRAZolam Duanne Moron) 1 MG tablet Take 1 mg by mouth at bedtime.    [provider]  amoxicillin-clavulanate (AUGMENTIN) 875-125 MG tablet Take 1 tablet by mouth every 12 (twelve) hours. 05/20/19   Hall-Potvin, [place], PA-C  ARIPiprazole (ABILIFY) 10 MG tablet Take 10 mg by mouth daily.    [provider]  butalbital-acetaminophen-caffeine (FIORICET, ESGIC) 50-325-40 MG per tablet Take 1 tablet by mouth every 4 (four) hours as needed for headache.    [provider]  diclofenac (VOLTAREN) 75 MG EC tablet Take 1 tablet (75 mg total) by mouth 2 (two) times daily. 06/26/16  Law, Alexandra M, PA-C  fluticasone (FLONASE) 50 MCG/ACT nasal spray Place 2 sprays into both nostrils 2 (two) times daily.    [provider]  guaiFENesin (MUCINEX) 600 MG 12 hr tablet Take 1,200 mg by mouth 2 (two) times daily.    [provider]  guaiFENesin (MUCINEX) 600 MG 12 hr tablet Take 1 tablet (600 mg total) by mouth 2 (two) times daily. 05/25/19   Natane Heward, MD  HYDROcodone-acetaminophen (NORCO/VICODIN) 5-325 MG tablet Take 1-2 tablets by mouth every 6 (six) hours as needed. 06/26/16   Law, Waylan Boga, PA-C  ibuprofen (ADVIL,MOTRIN) 200 MG tablet Take 200-400 mg by mouth every 6 (six) hours as needed for headache or moderate pain.    [provider]  ibuprofen (ADVIL,MOTRIN) 600 MG tablet Take 1 tablet (600 mg total) by mouth every 6 (six) hours as needed. Patient not taking: Reported on 07/23/2014 04/14/13   Horton, Mayer Masker, MD  insulin aspart (NOVOLOG) 100 UNIT/ML injection Inject into the skin 3 (three) times daily before meals.    [provider]  Insulin Human (INSULIN PUMP) SOLN Inject into the skin. Pump in left arm--Humalog.    [provider]  levothyroxine (SYNTHROID, LEVOTHROID) 175 MCG tablet Take 175 mcg by mouth at bedtime.     [provider]  meloxicam (MOBIC) 15 MG tablet 1 pill daily for 1 week then as needed. 05/17/19   Hall-Potvin, [place], PA-C  neomycin-polymyxin-hydrocortisone (CORTISPORIN) 3.5-10000-1 OTIC suspension Place 4 drops into the left ear 3 (three) times daily. 05/17/19   Hall-Potvin, [place], PA-C  predniSONE (DELTASONE) 20 MG tablet Take 1 tablet (20 mg total) by mouth daily. 05/20/19   Hall-Potvin, [place], PA-C  rosuvastatin (CRESTOR) 40 MG tablet Take 40 mg by mouth at bedtime.    [provider]  sertraline (ZOLOFT) 100 MG tablet Take 2 tablets (200 mg total) by mouth daily. Patient not taking: Reported on 07/23/2014 06/17/12   Sherren Mocha, MD  tiZANidine (ZANAFLEX) 4 MG tablet Take 4 mg by mouth every 6 (six) hours as needed for muscle spasms.    [provider]  venlafaxine (EFFEXOR) 100 MG tablet Take 100 mg by mouth 2 (two) times daily.    [provider]    Allergies    Imitrex [sumatriptan] and Triptans  Review of Systems   Review of Systems  Constitutional: Negative for diaphoresis and fever.  HENT: Negative for ear pain and sore throat.   Eyes: Negative for visual disturbance.  Respiratory: Positive for cough and shortness of breath. Negative for hemoptysis.   Cardiovascular: Negative for chest pain, syncope and PND.  Gastrointestinal: Negative for abdominal pain and vomiting.  Genitourinary: Negative for difficulty  urinating.  Musculoskeletal: Negative for neck pain.  Neurological: Negative for weakness and headaches.  All other systems reviewed and are negative.   Physical Exam Updated Vital Signs BP 136/67 (BP Location: Right Arm)   Pulse 77   Temp 98.3 F (36.8 C) (Oral)   Resp 19   SpO2 100%   Physical Exam Vitals and nursing note reviewed.  Constitutional:      Appearance: Normal appearance.  HENT:     Head: Normocephalic and atraumatic.     Nose: Nose normal.  Eyes:     Conjunctiva/sclera: Conjunctivae normal.     Pupils: Pupils are equal, round, and reactive to light.  Cardiovascular:     Rate and Rhythm: Normal rate and regular rhythm.     Pulses: Normal pulses.  Heart sounds: Normal heart sounds.  Pulmonary:     Effort: Pulmonary effort is normal.     Breath sounds: Normal breath sounds.  Abdominal:     General: Abdomen is flat. Bowel sounds are normal.     Tenderness: There is no abdominal tenderness. There is no guarding.  Musculoskeletal:        General: Normal range of motion.     Cervical back: Normal range of motion and neck supple.  Skin:    General: Skin is warm and dry.     Capillary Refill: Capillary refill takes less than 2 seconds.  Neurological:     General: No focal deficit present.     Mental Status: She is alert.  Psychiatric:        Mood and Affect: Mood normal.        Behavior: Behavior normal.     ED Results / Procedures / Treatments   Labs (all labs ordered are listed, but only abnormal results are displayed) Labs Reviewed  CBC WITH DIFFERENTIAL/PLATELET - Abnormal; Notable for the following components:      Result Value   WBC 13.8 (*)    Neutro Abs 11.6 (*)    Abs Immature Granulocytes 0.17 (*)    All other components within normal limits  BASIC METABOLIC PANEL - Abnormal; Notable for the following components:   Sodium 133 (*)    CO2 20 (*)    Glucose, Bld 393 (*)    BUN 22 (*)    All other components within normal limits  I-STAT  BETA HCG BLOOD, ED ([facility], [facility], [facility] ONLY)    EKG EKG Interpretation  Date/Time:  Sunday May 24 2019 20:17:14 EST Ventricular Rate:  80 PR Interval:  138 QRS Duration: 86 QT Interval:  372 QTC Calculation: 429 R Axis:   -24 Text Interpretation: Normal sinus rhythm Possible Anterior infarct , age undetermined Abnormal ECG When compared with ECG of 07/23/2014, Premature ventricular complexes are no longer present Confirmed by Dione Booze ([id number]) on 05/24/2019 11:17:39 PM   Radiology DG Chest Portable 1 View  Result Date: 05/24/2019 CLINICAL DATA:  39 year old female with shortness of breath and nonproductive cough for 2 weeks. EXAM: PORTABLE CHEST 1 VIEW COMPARISON:  Chest radiographs 07/23/2014 and earlier. FINDINGS: Portable AP upright view at 2308 hours. Lung volumes and mediastinal contours remain within normal limits. Eventration of the right hemidiaphragm again noted (normal variant). Visualized tracheal air column is within normal limits. Allowing for portable technique the lungs are clear. Negative visible bowel gas pattern and osseous structures. IMPRESSION: Negative portable chest. Electronically Signed   By: Odessa Fleming M.D.   On: 05/24/2019 23:17    Procedures Procedures (including critical care time)  Medications Ordered in ED Medications  guaiFENesin (MUCINEX) 12 hr tablet 1,200 mg (1,200 mg Oral Given 05/25/19 0220)  acetaminophen (TYLENOL) tablet 1,000 mg (1,000 mg Oral Given 05/25/19 0220)    ED Course  I have reviewed the triage vital signs and the nursing notes.  Pertinent labs & imaging results that were available during my care of the patient were reviewed by me and considered in my medical decision making (see chart for details).    MDM Rules/Calculators/A&P PERC negative wells 0 highly doubt PE in this low risk patient.  Patient is already on inhalers, steroids, oral antibiotics and mobic for her symptoms.  She has refused an additional covid test stating she has had  2 in the past week that were both negative. Patient wanted to  know if she could have pleurisy because it hurts when she coughs.  She has no pleural rub and is already on high dose NSAIDs and I explained that coughing can hurt. She is angry regarding the wait.  EDP apologized for the wait this evening.  Patient was awaiting meds but after yelling at the writer decision was made to discharge the patient who is saturation id 100% on RA with a normal HR and RR.    Denise Collins was evaluated in Emergency Department on 05/25/2019 for the symptoms described in the history of present illness. She was evaluated in the context of the global COVID-19 pandemic, which necessitated consideration that the patient might be at risk for infection with the SARS-CoV-2 virus that causes COVID-19. Institutional protocols and algorithms that pertain to the evaluation of patients at risk for COVID-19 are in a state of rapid change based on information released by regulatory bodies including the CDC and federal and state organizations. These policies and algorithms were followed during the patient's care in the ED.  Final Clinical Impression(s) / ED Diagnoses Final diagnoses:  Cough   Return for intractable cough, coughing up blood,fevers >100.4 unrelieved by medication, shortness of breath, intractable vomiting, chest pain, shortness of breath, weakness,numbness, changes in speech, facial asymmetry,abdominal pain, passing out,Inability to tolerate liquids or food, cough, altered mental status or any concerns. No signs of systemic illness or infection. The patient is nontoxic-appearing on exam and vital signs are within normal limits.   I have reviewed the triage vital signs and the nursing notes. Pertinent labs &imaging results that were available during my care of the patient were reviewed by me and considered in my medical decision making (see chart for details).  After history, exam, and medical workup I feel  the patient has been appropriately medically screened and is safe for discharge home. Pertinent diagnoses were discussed with the patient. Patient was given return    Rx / DC Orders ED Discharge Orders         Ordered    guaiFENesin (MUCINEX) 600 MG 12 hr tablet  2 times daily     05/25/19 0221           Rosebud Koenen, MD 05/25/19 2947    Nicanor Alcon, Arthur Speagle, MD 06/17/19 2354

## 2019-05-31 ENCOUNTER — Ambulatory Visit (INDEPENDENT_AMBULATORY_CARE_PROVIDER_SITE_OTHER): Payer: 59

## 2019-05-31 ENCOUNTER — Encounter: Payer: Self-pay | Admitting: Emergency Medicine

## 2019-05-31 ENCOUNTER — Ambulatory Visit
Admission: EM | Admit: 2019-05-31 | Discharge: 2019-05-31 | Disposition: A | Discharge status: Home / Self Care | Payer: 59 | Attending: Emergency Medicine | Admitting: Emergency Medicine

## 2019-05-31 ENCOUNTER — Other Ambulatory Visit: Payer: Self-pay

## 2019-05-31 DIAGNOSIS — R0600 Dyspnea, unspecified: Secondary | ICD-10-CM | POA: Diagnosis not present

## 2019-05-31 DIAGNOSIS — R0602 Shortness of breath: Secondary | ICD-10-CM | POA: Diagnosis not present

## 2019-05-31 DIAGNOSIS — R197 Diarrhea, unspecified: Secondary | ICD-10-CM | POA: Diagnosis not present

## 2019-05-31 DIAGNOSIS — R05 Cough: Secondary | ICD-10-CM

## 2019-05-31 DIAGNOSIS — R0609 Other forms of dyspnea: Secondary | ICD-10-CM

## 2019-05-31 DIAGNOSIS — Z20822 Contact with and (suspected) exposure to covid-19: Secondary | ICD-10-CM

## 2019-05-31 IMAGING — DX DG CHEST 2V
2 series · 2 of 2 positions shown · non-contrast
Comparison: Chest x-ray [DATE].

CLINICAL DATA: 38-year-old female.  Cough and dyspnea on exertion.

EXAM:
CHEST - 2 VIEW

[chest pa]
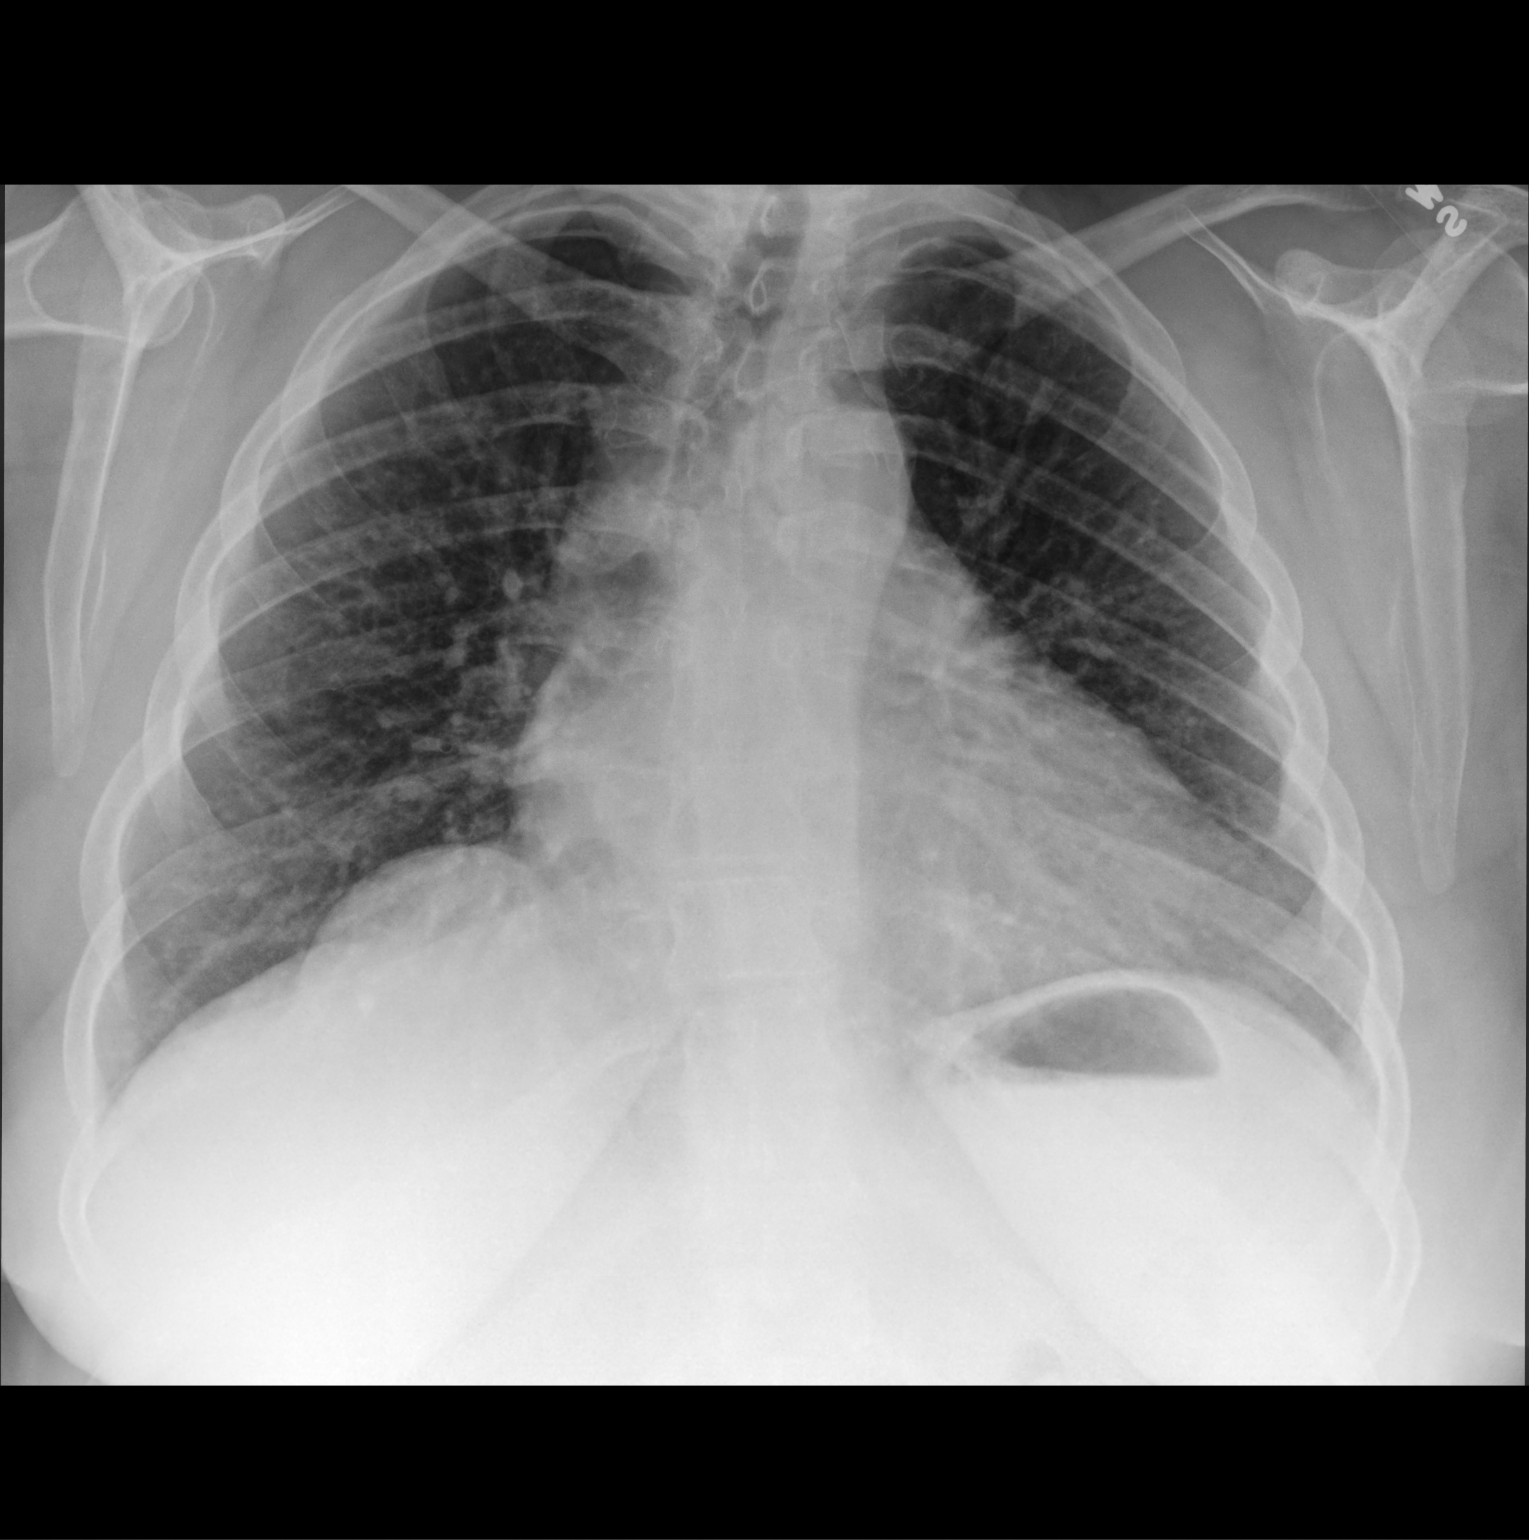

[chest lat]
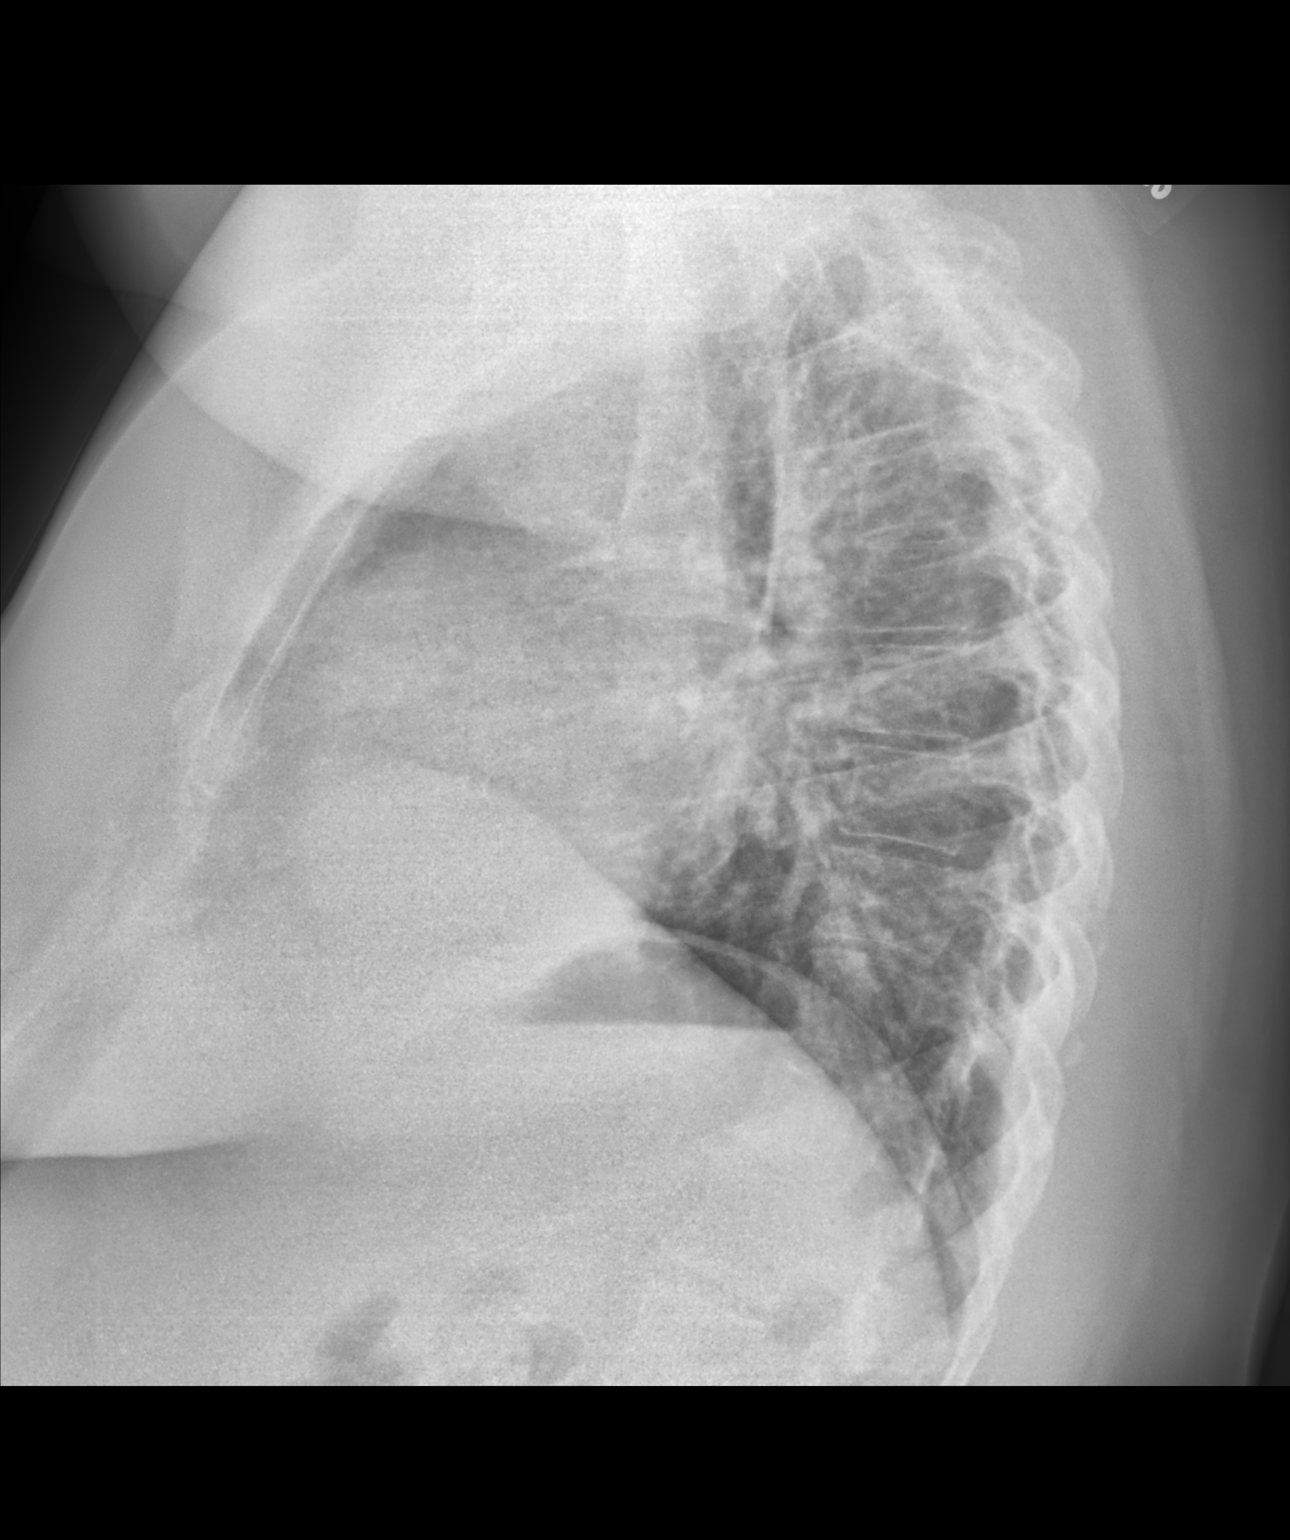

[2 of 2 positions shown; findings below may reference images not displayed]

FINDINGS: Lung volumes are normal. No consolidative airspace disease. No
pleural effusions. No pneumothorax. No pulmonary nodule or mass
noted. Pulmonary vasculature and the cardiomediastinal silhouette
are within normal limits.
IMPRESSION: No radiographic evidence of acute cardiopulmonary disease.

## 2019-05-31 NOTE — ED Provider Notes (Signed)
[name]    [name]: [id number] Arrival date & time: 05/31/19  1458      History   Chief Complaint Chief Complaint  Patient presents with  . APPT: 1500  . Fatigue    HPI Denise Collins is a 39 y.o. female with history of diabetes, thyroid disease, migraines, asthma, allergies presenting for persistent URI symptoms.  Patient has been seen twice in urgent care setting since January.  Patient states symptom onset was sometime in December.  Throughout course of illness, patient has also been evaluated in ER with unremarkable work-up.  Patient has completed course of antibiotics (Augmentin), steroid (prednisone), and has been compliant with her albuterol inhaler.  Patient still endorsing dyspnea with exertion: Denies lightheadedness, palpitations, chest pain with this.  Patient is compliant with her CPAP (no O2 requirement), and recently started Ozempic last night per her endocrinologist.  Patient verbalizes frustration that she has not been able to have an appointment with her primary care throughout this course due to concern for Covid.  Patient endorsing emesis Wednesday: No nausea or emesis since as well as diarrhea that is without blood, melena, pain.  Has not taken anything for this.   Past Medical History:  Diagnosis Date  . Allergy   . Anemia   . Anxiety   . Asthma   . Depression   . Diabetes mellitus without complication (HCC)   . Migraine   . Thyroid disease     Patient Active Problem List   Diagnosis Date Noted  . Thoracic back sprain 06/04/2012    Past Surgical History:  Procedure Laterality Date  . ABDOMINAL HYSTERECTOMY    . LAPAROSCOPY ABDOMEN DIAGNOSTIC    . TONSILLECTOMY      OB History   No obstetric history on file.      Home Medications    Prior to Admission medications   Medication Sig Start Date End Date Taking? Authorizing Provider  albuterol (VENTOLIN HFA) 108 (90 Base) MCG/ACT inhaler Inhale 2 puffs into the lungs every 4 (four)  hours as needed for wheezing or shortness of breath. 05/20/19   Hall-Potvin, [place], PA-C  ALPRAZolam (XANAX PO) Take by mouth.    [provider]  ALPRAZolam Prudy Feeler) 1 MG tablet Take 1 mg by mouth at bedtime.    [provider]  ARIPiprazole (ABILIFY) 10 MG tablet Take 10 mg by mouth daily.    [provider]  butalbital-acetaminophen-caffeine (FIORICET, ESGIC) 50-325-40 MG per tablet Take 1 tablet by mouth every 4 (four) hours as needed for headache.    [provider]  diclofenac (VOLTAREN) 75 MG EC tablet Take 1 tablet (75 mg total) by mouth 2 (two) times daily. 06/26/16   Law, Waylan Boga, PA-C  fluticasone (FLONASE) 50 MCG/ACT nasal spray Place 2 sprays into both nostrils 2 (two) times daily.    [provider]  guaiFENesin (MUCINEX) 600 MG 12 hr tablet Take 1 tablet (600 mg total) by mouth 2 (two) times daily. Patient taking differently: Take 600 mg by mouth 2 (two) times daily. Taking once a day 05/25/19   Palumbo, April, MD  ibuprofen (ADVIL,MOTRIN) 200 MG tablet Take 200-400 mg by mouth every 6 (six) hours as needed for headache or moderate pain.    [provider]  ibuprofen (ADVIL,MOTRIN) 600 MG tablet Take 1 tablet (600 mg total) by mouth every 6 (six) hours as needed. Patient not taking: Reported on 07/23/2014 04/14/13   Horton, Mayer Masker, MD  insulin aspart (NOVOLOG) 100  UNIT/ML injection Inject into the skin 3 (three) times daily before meals.    [provider]  Insulin Human (INSULIN PUMP) SOLN Inject into the skin. Pump in left arm--Humalog.    [provider]  levothyroxine (SYNTHROID, LEVOTHROID) 175 MCG tablet Take 175 mcg by mouth at bedtime.     [provider]  meloxicam (MOBIC) 15 MG tablet 1 pill daily for 1 week then as needed. 05/17/19   Hall-Potvin, [place], PA-C  rosuvastatin (CRESTOR) 40 MG tablet Take 40 mg by mouth at bedtime.    [provider]  sertraline (ZOLOFT) 100 MG tablet  Take 2 tablets (200 mg total) by mouth daily. Patient not taking: Reported on 07/23/2014 06/17/12   Sherren Mocha, MD  tiZANidine (ZANAFLEX) 4 MG tablet Take 4 mg by mouth every 6 (six) hours as needed for muscle spasms.    [provider]  venlafaxine (EFFEXOR) 100 MG tablet Take 100 mg by mouth 2 (two) times daily.    [provider]    Family History Family History  Problem Relation Age of Onset  . Cancer Mother   . Heart disease Father   . Hypertension Father   . Diabetes Father     Social History Social History   Tobacco Use  . Smoking status: Current Every Day Smoker    Packs/day: 1.00    Years: 20.00    Pack years: 20.00    Types: Cigarettes  . Smokeless tobacco: Never Used  Substance Use Topics  . Alcohol use: Yes    Comment: weekly  . Drug use: No     Allergies   Imitrex [sumatriptan] and Triptans   Review of Systems As per HPI   Physical Exam Triage Vital Signs ED Triage Vitals  Enc Vitals Group     BP      Pulse      Resp      Temp      Temp src      SpO2      Weight      Height      Head Circumference      Peak Flow      Pain Score      Pain Loc      Pain Edu?      Excl. in GC?    No data found.  Updated Vital Signs BP (!) 158/103 (BP Location: Left Arm)   Pulse 84   Temp 97.8 F (36.6 C) (Temporal)   Resp 18   SpO2 98% Comment: taken by APP during assessment  Visual Acuity Right Eye Distance:   Left Eye Distance:   Bilateral Distance:    Right Eye Near:   Left Eye Near:    Bilateral Near:     Physical Exam Constitutional:      General: She is not in acute distress.    Appearance: She is obese. She is not toxic-appearing or diaphoretic.  HENT:     Head: Normocephalic and atraumatic.     Mouth/Throat:     Mouth: Mucous membranes are moist.     Pharynx: Oropharynx is clear.  Eyes:     General: No scleral icterus.    Conjunctiva/sclera: Conjunctivae normal.     Pupils: Pupils are equal, round, and reactive  to light.  Neck:     Comments: Trachea midline, negative JVD Cardiovascular:     Rate and Rhythm: Normal rate and regular rhythm.     Heart sounds: No murmur. No gallop.  Pulmonary:     Effort: Pulmonary effort is normal. No respiratory distress.     Breath sounds: No stridor. No wheezing, rhonchi or rales.     Comments: Decreased breath sounds bilaterally without appreciable adventitia Abdominal:     General: Bowel sounds are normal.     Tenderness: There is no abdominal tenderness. There is no guarding.  Musculoskeletal:        General: No deformity.     Right lower leg: No edema.     Left lower leg: No edema.  Skin:    Capillary Refill: Capillary refill takes less than 2 seconds.     Coloration: Skin is not jaundiced or pale.  Neurological:     General: No focal deficit present.     Mental Status: She is alert and oriented to person, place, and time.      UC Treatments / Results  Labs (all labs ordered are listed, but only abnormal results are displayed) Labs Reviewed  NOVEL CORONAVIRUS, NAA    EKG   Radiology DG Chest 2 View  Result Date: 05/31/2019 CLINICAL DATA:  39 year old female.  Cough and dyspnea on exertion. EXAM: CHEST - 2 VIEW COMPARISON:  Chest x-ray 05/24/2019. FINDINGS: Lung volumes are normal. No consolidative airspace disease. No pleural effusions. No pneumothorax. No pulmonary nodule or mass noted. Pulmonary vasculature and the cardiomediastinal silhouette are within normal limits. IMPRESSION: No radiographic evidence of acute cardiopulmonary disease. Electronically Signed   By: Vinnie Langton M.D.   On: 05/31/2019 16:16    Procedures Procedures (including critical care time)  Medications Ordered in UC Medications - No data to display  Initial Impression / Assessment and Plan / UC Course  I have reviewed the triage vital signs and the nursing notes.  Pertinent labs & imaging results that were available during my care of the patient were  reviewed by me and considered in my medical decision making (see chart for details).     Patient afebrile, nontoxic in office today.  Patient appears to be well-hydrated, no acute distress.  No concerning signs regarding diarrhea, nor she having nausea.  Given persistent dyspnea since discharge from ER on 1/10, chest x-ray obtained in office.  This was reviewed by me and radiology at time of visit: Negative for pleural effusions, pneumothorax, nodules, mass is, infiltrates.  Stable since previous.  Reviewed findings with patient who verbalized understanding.  Recommended patient have further outpatient, nonacute work-up.  Covid PCR done at patient's request to "prove to my PCP that I do not have Covid".  Return precautions discussed, patient verbalized understanding and is agreeable to plan. Final Clinical Impressions(s) / UC Diagnoses   Final diagnoses:  Dyspnea on exertion  Diarrhea, unspecified type     Discharge Instructions     You have undergone multiple negative Covid PCR test: 1 more was done today in an effort to reassure PCP that you do not have acute COVID-19 infection. Please continue monitoring symptoms, reducing activity level, staying hydrated, using inhaler as needed. This will likely require further outpatient/nonacute work-up for other possible causes. Chest x-ray was negative today.    ED Prescriptions    None     PDMP not reviewed this encounter.   Hall-Potvin, [place], [place] 05/31/19 1651

## 2019-05-31 NOTE — ED Triage Notes (Signed)
Patient presents to [facility] for assessment of continued URI symptoms since her previous visits in December.  Patient states she feels so fatigued just trying to take a shower.  States she feels she has a hard time getting a good breath in because her throat feels tight.  Starting Wednesday she began having diarrhea.  Patient states if she tries to eat anything "it goes straight through me" but does not have BMs if she does not eat.  Pt c/o 1 episode of emesis on Wednesday, denies continuing nausea.

## 2019-05-31 NOTE — Discharge Instructions (Signed)
You have undergone multiple negative Covid PCR test: 1 more was done today in an effort to reassure PCP that you do not have acute COVID-19 infection. Please continue monitoring symptoms, reducing activity level, staying hydrated, using inhaler as needed. This will likely require further outpatient/nonacute work-up for other possible causes. Chest x-ray was negative today.

## 2019-05-31 NOTE — ED Notes (Signed)
Patient able to ambulate independently  

## 2019-06-01 LAB — NOVEL CORONAVIRUS, NAA: SARS-CoV-2, NAA: NOT DETECTED

## 2019-07-20 DIAGNOSIS — E113299 Type 2 diabetes mellitus with mild nonproliferative diabetic retinopathy without macular edema, unspecified eye: Secondary | ICD-10-CM | POA: Insufficient documentation

## 2019-08-13 ENCOUNTER — Ambulatory Visit: Payer: 59 | Attending: Internal Medicine

## 2019-08-13 DIAGNOSIS — Z23 Encounter for immunization: Secondary | ICD-10-CM

## 2019-08-13 NOTE — Progress Notes (Signed)
   Covid-19 Vaccination Clinic  Name:  Denise Collins    MRN: [id number] DOB: 1981/03/31  08/13/2019  [name] was observed post Covid-19 immunization for 15 minutes without incident. She was provided with Vaccine Information Sheet and instruction to access the V-Safe system.   [name] was instructed to call 911 with any severe reactions post vaccine: Marland Kitchen Difficulty breathing  . Swelling of face and throat  . A fast heartbeat  . A bad rash all over body  . Dizziness and weakness   Immunizations Administered    Name Date Dose VIS Date Route   Pfizer COVID-19 Vaccine 08/13/2019  9:54 AM 0.3 mL 04/24/2019 Intramuscular   Manufacturer: [employer], [employer]   Lot: [id number]   NDC: [address]-1

## 2019-09-09 ENCOUNTER — Ambulatory Visit: Payer: 59 | Attending: Internal Medicine

## 2019-09-09 DIAGNOSIS — Z23 Encounter for immunization: Secondary | ICD-10-CM

## 2019-09-09 NOTE — Progress Notes (Signed)
   Covid-19 Vaccination Clinic  Name:  Denise Collins    MRN: [id number] DOB: Feb 24, 1981  09/09/2019  [name] was observed post Covid-19 immunization for 15 minutes without incident. She was provided with Vaccine Information Sheet and instruction to access the V-Safe system.   [name] was instructed to call 911 with any severe reactions post vaccine: Marland Kitchen Difficulty breathing  . Swelling of face and throat  . A fast heartbeat  . A bad rash all over body  . Dizziness and weakness   Immunizations Administered    Name Date Dose VIS Date Route   Pfizer COVID-19 Vaccine 09/09/2019 10:03 AM 0.3 mL 07/08/2018 Intramuscular   Manufacturer: [employer], [employer]   Lot: [id number]   NDC: [address]-5

## 2020-03-03 ENCOUNTER — Other Ambulatory Visit: Payer: Self-pay

## 2020-03-03 ENCOUNTER — Ambulatory Visit
Admission: EM | Admit: 2020-03-03 | Discharge: 2020-03-03 | Disposition: A | Discharge status: Home / Self Care | Payer: 59 | Attending: Physician Assistant | Admitting: Physician Assistant

## 2020-03-03 DIAGNOSIS — R0981 Nasal congestion: Secondary | ICD-10-CM | POA: Insufficient documentation

## 2020-03-03 DIAGNOSIS — J029 Acute pharyngitis, unspecified: Secondary | ICD-10-CM | POA: Diagnosis not present

## 2020-03-03 LAB — POCT RAPID STREP A (OFFICE): Rapid Strep A Screen: NEGATIVE

## 2020-03-03 MED ORDER — LIDOCAINE VISCOUS HCL 2 % MT SOLN: OROMUCOSAL | 0 refills | Status: DC

## 2020-03-03 MED ORDER — FLUTICASONE PROPIONATE 50 MCG/ACT NA SUSP: 2.0000 | Freq: Every day | NASAL | 0 refills | Status: DC

## 2020-03-03 NOTE — Discharge Instructions (Addendum)
Rapid strep negative. Symptoms are most likely due to viral illness/ drainage down your throat. Start lidocaine for sore throat, do not eat or drink for the next 40 mins after use as it can stunt your gag reflex. Flonase for nasal congestion/drainage. You can use over the counter nasal saline rinse such as neti pot for nasal congestion. Monitor for any worsening of symptoms, swelling of the throat, trouble breathing, trouble swallowing, leaning forward to breath, drooling, go to the emergency department for further evaluation needed.

## 2020-03-03 NOTE — ED Provider Notes (Signed)
[name]    [name]: [id number] Arrival date & time: 03/03/20  1757      History   Chief Complaint Chief Complaint  Patient presents with  . Otalgia  . Sore Throat    HPI Denise Collins is a 39 y.o. female.   39 year old female comes in for 2 day of URI symptoms. Sore throat, nasal congestion, right ear pain. Denies cough. Denies fever, chills, body aches. Denies abdominal pain, nausea, vomiting, diarrhea. Denies shortness of breath, loss of taste/smell. S/p tonsillectomy.     Past Medical History:  Diagnosis Date  . Allergy   . Anemia   . Anxiety   . Asthma   . Depression   . Diabetes mellitus without complication (HCC)   . Migraine   . Thyroid disease     Patient Active Problem List   Diagnosis Date Noted  . Thoracic back sprain 06/04/2012    Past Surgical History:  Procedure Laterality Date  . ABDOMINAL HYSTERECTOMY    . LAPAROSCOPY ABDOMEN DIAGNOSTIC    . TONSILLECTOMY      OB History   No obstetric history on file.      Home Medications    Prior to Admission medications   Medication Sig Start Date End Date Taking? Authorizing Provider  ALPRAZolam (XANAX PO) Take by mouth.   Yes [provider]  ALPRAZolam Prudy Feeler) 1 MG tablet Take 1 mg by mouth at bedtime.   Yes [provider]  insulin aspart (NOVOLOG) 100 UNIT/ML injection Inject into the skin 3 (three) times daily before meals.   Yes [provider]  levothyroxine (SYNTHROID, LEVOTHROID) 175 MCG tablet Take 175 mcg by mouth at bedtime.    Yes [provider]  rosuvastatin (CRESTOR) 40 MG tablet Take 40 mg by mouth at bedtime.   Yes [provider]  venlafaxine (EFFEXOR) 100 MG tablet Take 100 mg by mouth 2 (two) times daily.   Yes [provider]  albuterol (VENTOLIN HFA) 108 (90 Base) MCG/ACT inhaler Inhale 2 puffs into the lungs every 4 (four) hours as needed for wheezing or shortness of breath. 05/20/19   Hall-Potvin, [place],  PA-C  ARIPiprazole (ABILIFY) 10 MG tablet Take 10 mg by mouth daily.    [provider]  fluticasone (FLONASE) 50 MCG/ACT nasal spray Place 2 sprays into both nostrils daily. 03/03/20   Cathie Hoops, Vianna Venezia V, PA-C  guaiFENesin (MUCINEX) 600 MG 12 hr tablet Take 1 tablet (600 mg total) by mouth 2 (two) times daily. Patient taking differently: Take 600 mg by mouth 2 (two) times daily. Taking once a day 05/25/19   Palumbo, April, MD  ibuprofen (ADVIL,MOTRIN) 200 MG tablet Take 200-400 mg by mouth every 6 (six) hours as needed for headache or moderate pain.    [provider]  Insulin Human (INSULIN PUMP) SOLN Inject into the skin. Pump in left arm--Humalog.    [provider]  lidocaine (XYLOCAINE) 2 % solution 5-15 mL gargle as needed 03/03/20   Cathie Hoops, Earma Nicolaou V, PA-C  sertraline (ZOLOFT) 100 MG tablet Take 2 tablets (200 mg total) by mouth daily. Patient not taking: Reported on 07/23/2014 06/17/12 03/03/20  Sherren Mocha, MD    Family History Family History  Problem Relation Age of Onset  . Cancer Mother   . Heart disease Father   . Hypertension Father   . Diabetes Father     Social History Social History   Tobacco Use  . Smoking status: Current Every Day  Smoker    Packs/day: 1.00    Years: 20.00    Pack years: 20.00    Types: Cigarettes  . Smokeless tobacco: Never Used  Vaping Use  . Vaping Use: Never used  Substance Use Topics  . Alcohol use: Yes    Comment: weekly  . Drug use: No     Allergies   Imitrex [sumatriptan] and Triptans   Review of Systems Review of Systems  Reason unable to perform ROS: See HPI as above.     Physical Exam Triage Vital Signs ED Triage Vitals  Enc Vitals Group     BP 03/03/20 1917 119/86     Pulse Rate 03/03/20 1915 92     Resp 03/03/20 1915 17     Temp 03/03/20 1915 98.3 F (36.8 C)     Temp Source 03/03/20 1915 Oral     SpO2 03/03/20 1915 96 %     Weight --      Height --      Head Circumference --      Peak Flow --       Pain Score 03/03/20 1910 7     Pain Loc --      Pain Edu? --      Excl. in GC? --    No data found.  Updated Vital Signs BP 119/86   Pulse 92   Temp 98.3 F (36.8 C) (Oral)   Resp 17   SpO2 96%   Physical Exam Constitutional:      General: She is not in acute distress.    Appearance: Normal appearance. She is well-developed. She is not ill-appearing, toxic-appearing or diaphoretic.  HENT:     Head: Normocephalic and atraumatic.     Right Ear: Ear canal and external ear normal. Tympanic membrane is not erythematous or bulging.     Left Ear: Ear canal and external ear normal. Tympanic membrane is not erythematous or bulging.     Ears:     Comments: Bilateral TM opaque    Nose:     Right Sinus: No maxillary sinus tenderness or frontal sinus tenderness.     Left Sinus: No maxillary sinus tenderness or frontal sinus tenderness.     Mouth/Throat:     Mouth: Mucous membranes are moist.     Pharynx: Oropharynx is clear. Uvula midline.     Comments: Handling own secretions well.  Eyes:     Conjunctiva/sclera: Conjunctivae normal.     Pupils: Pupils are equal, round, and reactive to light.  Cardiovascular:     Rate and Rhythm: Normal rate and regular rhythm.  Pulmonary:     Effort: Pulmonary effort is normal. No accessory muscle usage, prolonged expiration, respiratory distress or retractions.     Breath sounds: No decreased air movement or transmitted upper airway sounds. No decreased breath sounds.     Comments: LCTAB Musculoskeletal:     Cervical back: Normal range of motion and neck supple.  Skin:    General: Skin is warm and dry.  Neurological:     Mental Status: She is alert and oriented to person, place, and time.      UC Treatments / Results  Labs (all labs ordered are listed, but only abnormal results are displayed) Labs Reviewed  CULTURE, GROUP A STREP [facility])  POCT RAPID STREP A (OFFICE)    EKG   Radiology No results found.  Procedures Procedures  (including critical care time)  Medications Ordered in UC Medications - No data to display  Initial Impression / Assessment and Plan / UC Course  I have reviewed the triage vital signs and the nursing notes.  Pertinent labs & imaging results that were available during my care of the patient were reviewed by me and considered in my medical decision making (see chart for details).    Rapid strep negative. Patient declined COVID testing. Patient is nontoxic in appearance. Handling own secretions well. Symptomatic treatment as needed. Return precautions given.   Final Clinical Impressions(s) / UC Diagnoses   Final diagnoses:  Sore throat  Nasal congestion    ED Prescriptions    Medication Sig Dispense Auth. Provider   fluticasone (FLONASE) 50 MCG/ACT nasal spray Place 2 sprays into both nostrils daily. 1 g Treonna Klee V, PA-C   lidocaine (XYLOCAINE) 2 % solution 5-15 mL gargle as needed 150 mL Belinda Fisher, PA-C     PDMP not reviewed this encounter.   Belinda Fisher, PA-C 03/03/20 2011

## 2020-03-03 NOTE — ED Triage Notes (Signed)
Patient in with c/o st, congestion and right ear pain that started yesterday. States that she has difficulty eat and drinking due to ST  Patient has been taking zyrtec for sxs with no relief  Denies, n/v, diarrhea, fever, runny nose or other sxs

## 2020-03-06 ENCOUNTER — Ambulatory Visit
Admission: EM | Admit: 2020-03-06 | Discharge: 2020-03-06 | Disposition: A | Discharge status: Home / Self Care | Payer: 59 | Attending: Emergency Medicine | Admitting: Emergency Medicine

## 2020-03-06 ENCOUNTER — Encounter: Payer: Self-pay | Admitting: Emergency Medicine

## 2020-03-06 ENCOUNTER — Other Ambulatory Visit: Payer: Self-pay

## 2020-03-06 DIAGNOSIS — J069 Acute upper respiratory infection, unspecified: Secondary | ICD-10-CM | POA: Diagnosis not present

## 2020-03-06 DIAGNOSIS — Z20822 Contact with and (suspected) exposure to covid-19: Secondary | ICD-10-CM | POA: Diagnosis not present

## 2020-03-06 LAB — CULTURE, GROUP A STREP (THRC)

## 2020-03-06 MED ORDER — METHYLPREDNISOLONE SODIUM SUCC 125 MG IJ SOLR
125.0000 mg | Freq: Once | INTRAMUSCULAR | Status: AC
  Administered 2020-03-06: 125 mg via INTRAMUSCULAR

## 2020-03-06 NOTE — ED Triage Notes (Signed)
Pt sts sore throat and bilateral ear pain; pt sts seen here Thursday for same but feeling worse

## 2020-03-06 NOTE — Discharge Instructions (Signed)
Your COVID test is pending - it is important to quarantine / isolate at home until your results are back. °If you test positive and would like further evaluation for persistent or worsening symptoms, you may schedule an E-visit or virtual (video) visit throughout the [facility] MyChart app or website. ° °PLEASE NOTE: If you develop severe chest pain or shortness of breath please go to the ER or call [phone] for further evaluation --> DO NOT schedule electronic or virtual visits for this. °Please call our office for further guidance / recommendations as needed. ° °For information about the Covid vaccine, please visit [email or web address] °

## 2020-03-06 NOTE — ED Provider Notes (Signed)
[name]    [name]: [id number] Arrival date & time: 03/06/20  1241      History   Chief Complaint Chief Complaint  Patient presents with  . Otalgia  . Sore Throat    HPI Denise Collins is a 39 y.o. female  Presenting for persistent sore throat and right ear pain.  Patient initially evaluated for this on 10/21: Please with records, reviewed by me at time of visit.  Patient had declined Covid testing, given supportive care including viscous lidocaine, Flonase.  Past Medical History:  Diagnosis Date  . Allergy   . Anemia   . Anxiety   . Asthma   . Depression   . Diabetes mellitus without complication (HCC)   . Migraine   . Thyroid disease     Patient Active Problem List   Diagnosis Date Noted  . Thoracic back sprain 06/04/2012    Past Surgical History:  Procedure Laterality Date  . ABDOMINAL HYSTERECTOMY    . LAPAROSCOPY ABDOMEN DIAGNOSTIC    . TONSILLECTOMY      OB History   No obstetric history on file.      Home Medications    Prior to Admission medications   Medication Sig Start Date End Date Taking? Authorizing Provider  albuterol (VENTOLIN HFA) 108 (90 Base) MCG/ACT inhaler Inhale 2 puffs into the lungs every 4 (four) hours as needed for wheezing or shortness of breath. 05/20/19   Hall-Potvin, [place], PA-C  ALPRAZolam (XANAX PO) Take by mouth.    [provider]  ALPRAZolam Prudy Feeler) 1 MG tablet Take 1 mg by mouth at bedtime.    [provider]  ARIPiprazole (ABILIFY) 10 MG tablet Take 10 mg by mouth daily.    [provider]  fluticasone (FLONASE) 50 MCG/ACT nasal spray Place 2 sprays into both nostrils daily. 03/03/20   Cathie Hoops, Amy V, PA-C  guaiFENesin (MUCINEX) 600 MG 12 hr tablet Take 1 tablet (600 mg total) by mouth 2 (two) times daily. Patient taking differently: Take 600 mg by mouth 2 (two) times daily. Taking once a day 05/25/19   Palumbo, April, MD  ibuprofen (ADVIL,MOTRIN) 200 MG tablet Take 200-400 mg by  mouth every 6 (six) hours as needed for headache or moderate pain.    [provider]  insulin aspart (NOVOLOG) 100 UNIT/ML injection Inject into the skin 3 (three) times daily before meals.    [provider]  Insulin Human (INSULIN PUMP) SOLN Inject into the skin. Pump in left arm--Humalog.    [provider]  levothyroxine (SYNTHROID, LEVOTHROID) 175 MCG tablet Take 175 mcg by mouth at bedtime.     [provider]  lidocaine (XYLOCAINE) 2 % solution 5-15 mL gargle as needed 03/03/20   Cathie Hoops, Amy V, PA-C  rosuvastatin (CRESTOR) 40 MG tablet Take 40 mg by mouth at bedtime.    [provider]  venlafaxine (EFFEXOR) 100 MG tablet Take 100 mg by mouth 2 (two) times daily.    [provider]  sertraline (ZOLOFT) 100 MG tablet Take 2 tablets (200 mg total) by mouth daily. Patient not taking: Reported on 07/23/2014 06/17/12 03/03/20  Sherren Mocha, MD    Family History Family History  Problem Relation Age of Onset  . Cancer Mother   . Heart disease Father   . Hypertension Father   . Diabetes Father     Social History Social History   Tobacco Use  . Smoking status: Current Every Day Smoker    Packs/day:  1.00    Years: 20.00    Pack years: 20.00    Types: Cigarettes  . Smokeless tobacco: Never Used  Vaping Use  . Vaping Use: Never used  Substance Use Topics  . Alcohol use: Yes    Comment: weekly  . Drug use: No     Allergies   Imitrex [sumatriptan] and Triptans   Review of Systems As per HPI   Physical Exam Triage Vital Signs ED Triage Vitals  Enc Vitals Group     BP      Pulse      Resp      Temp      Temp src      SpO2      Weight      Height      Head Circumference      Peak Flow      Pain Score      Pain Loc      Pain Edu?      Excl. in GC?    No data found.  Updated Vital Signs BP (!) 165/109 (BP Location: Left Arm)   Pulse (!) 101   Temp 98.1 F (36.7 C) (Oral)   Resp 18   SpO2 95%   Visual  Acuity Right Eye Distance:   Left Eye Distance:   Bilateral Distance:    Right Eye Near:   Left Eye Near:    Bilateral Near:     Physical Exam Constitutional:      General: She is not in acute distress.    Appearance: She is obese. She is not ill-appearing or diaphoretic.  HENT:     Head: Normocephalic and atraumatic.     Right Ear: Tympanic membrane normal.     Left Ear: Tympanic membrane normal.     Nose:     Comments: Bilateral sinus tenderness, mild    Mouth/Throat:     Mouth: Mucous membranes are moist.     Pharynx: Oropharynx is clear. Uvula midline. No oropharyngeal exudate or uvula swelling.     Tonsils: No tonsillar exudate. 1+ on the right. 1+ on the left.     Comments: Cobblestoning present Eyes:     General: No scleral icterus.    Conjunctiva/sclera: Conjunctivae normal.     Pupils: Pupils are equal, round, and reactive to light.  Neck:     Comments: Trachea midline, negative JVD.  Mild TTP along cervical LN chain, no appreciable LAD Cardiovascular:     Rate and Rhythm: Normal rate and regular rhythm.     Heart sounds: No murmur heard.  No gallop.   Pulmonary:     Effort: Pulmonary effort is normal. No respiratory distress.     Breath sounds: No wheezing, rhonchi or rales.  Musculoskeletal:     Cervical back: Normal range of motion and neck supple. No tenderness.  Lymphadenopathy:     Cervical: No cervical adenopathy.  Skin:    Capillary Refill: Capillary refill takes less than 2 seconds.     Coloration: Skin is not jaundiced or pale.     Findings: No rash.  Neurological:     General: No focal deficit present.     Mental Status: She is alert and oriented to person, place, and time.      UC Treatments / Results  Labs (all labs ordered are listed, but only abnormal results are displayed) Labs Reviewed  NOVEL CORONAVIRUS, NAA    EKG   Radiology No results found.  Procedures Procedures (including  critical care time)  Medications Ordered in  UC Medications  methylPREDNISolone sodium succinate (SOLU-MEDROL) 125 mg/2 mL injection 125 mg (125 mg Intramuscular Given 03/06/20 1317)    Initial Impression / Assessment and Plan / UC Course  I have reviewed the triage vital signs and the nursing notes.  Pertinent labs & imaging results that were available during my care of the patient were reviewed by me and considered in my medical decision making (see chart for details).     Patient afebrile, nontoxic, with SpO2 95%.  Requesting Covid testing and steroid shot today: PCR pending.  Patient to quarantine until results are back.  We will treat supportively as outlined below.  Return precautions discussed, patient verbalized understanding and is agreeable to plan. Final Clinical Impressions(s) / UC Diagnoses   Final diagnoses:  Encounter for screening laboratory testing for COVID-19 virus  Viral URI     Discharge Instructions     Your COVID test is pending - it is important to quarantine / isolate at home until your results are back. If you test positive and would like further evaluation for persistent or worsening symptoms, you may schedule an E-visit or virtual (video) visit throughout the [facility] app or website.  PLEASE NOTE: If you develop severe chest pain or shortness of breath please go to the ER or call [phone] for further evaluation --> DO NOT schedule electronic or virtual visits for this. Please call our office for further guidance / recommendations as needed.  For information about the Covid vaccine, please visit [email or web address]    ED Prescriptions    None     PDMP not reviewed this encounter.   Hall-Potvin, [place], [place] 03/06/20 1428

## 2020-03-07 LAB — SARS-COV-2, NAA 2 DAY TAT

## 2020-03-07 LAB — NOVEL CORONAVIRUS, NAA: SARS-CoV-2, NAA: NOT DETECTED

## 2020-03-09 ENCOUNTER — Other Ambulatory Visit: Payer: Self-pay

## 2020-03-09 ENCOUNTER — Ambulatory Visit
Admission: EM | Admit: 2020-03-09 | Discharge: 2020-03-09 | Disposition: A | Discharge status: Home / Self Care | Payer: 59 | Attending: Emergency Medicine | Admitting: Emergency Medicine

## 2020-03-09 DIAGNOSIS — R0981 Nasal congestion: Secondary | ICD-10-CM | POA: Diagnosis not present

## 2020-03-09 DIAGNOSIS — J029 Acute pharyngitis, unspecified: Secondary | ICD-10-CM

## 2020-03-09 MED ORDER — AMOXICILLIN-POT CLAVULANATE 875-125 MG PO TABS: 1.0000 | ORAL_TABLET | Freq: Two times a day (BID) | ORAL | 0 refills | Status: DC

## 2020-03-09 MED ORDER — FLUCONAZOLE 150 MG PO TABS: 150.0000 mg | ORAL_TABLET | Freq: Every day | ORAL | 0 refills | Status: DC

## 2020-03-09 MED ORDER — METHYLPREDNISOLONE SODIUM SUCC 125 MG IJ SOLR
125.0000 mg | Freq: Once | INTRAMUSCULAR | Status: AC
  Administered 2020-03-09: 125 mg via INTRAMUSCULAR

## 2020-03-09 NOTE — ED Triage Notes (Signed)
Pt states she came in for a sore throat on Sunday and the result was negative. Pot has had a sore throat that has been worsening x over 1 week. Pt has a mild non productive cough. Pt is also complaining of congestion. Pt is aox4 and ambulatory.

## 2020-03-09 NOTE — ED Provider Notes (Signed)
[name]    [name]: [id number] Arrival date & time: 03/09/20  1817      History   Chief Complaint Chief Complaint  Patient presents with  . Sore Throat    x 1 week  . Nasal Congestion    x 1 week    HPI Denise Collins is a 39 y.o. female  Presenting for persistent sore throat.  Patient last seen for this by me on 10/24: Please see those records.  Covid test negative.  States she also has a mild, nonproductive cough.  No chest pain, difficulty breathing.  Endorsing persistent nasal congestion.  States steroid shot did help "for a day".    Past Medical History:  Diagnosis Date  . Allergy   . Anemia   . Anxiety   . Asthma   . Depression   . Diabetes mellitus without complication (HCC)   . Migraine   . Thyroid disease     Patient Active Problem List   Diagnosis Date Noted  . Thoracic back sprain 06/04/2012    Past Surgical History:  Procedure Laterality Date  . ABDOMINAL HYSTERECTOMY    . LAPAROSCOPY ABDOMEN DIAGNOSTIC    . TONSILLECTOMY      OB History   No obstetric history on file.      Home Medications    Prior to Admission medications   Medication Sig Start Date End Date Taking? Authorizing Provider  albuterol (VENTOLIN HFA) 108 (90 Base) MCG/ACT inhaler Inhale 2 puffs into the lungs every 4 (four) hours as needed for wheezing or shortness of breath. 05/20/19   Hall-Potvin, [place], PA-C  ALPRAZolam (XANAX PO) Take by mouth.    [provider]  ALPRAZolam Prudy Feeler) 1 MG tablet Take 1 mg by mouth at bedtime.    [provider]  amoxicillin-clavulanate (AUGMENTIN) 875-125 MG tablet Take 1 tablet by mouth every 12 (twelve) hours. 03/09/20   Hall-Potvin, [place], PA-C  ARIPiprazole (ABILIFY) 10 MG tablet Take 10 mg by mouth daily.    [provider]  fluconazole (DIFLUCAN) 150 MG tablet Take 1 tablet (150 mg total) by mouth daily. May repeat in 72 hours if needed 03/09/20   Hall-Potvin, [place], PA-C  fluticasone  (FLONASE) 50 MCG/ACT nasal spray Place 2 sprays into both nostrils daily. 03/03/20   Cathie Hoops, Amy V, PA-C  guaiFENesin (MUCINEX) 600 MG 12 hr tablet Take 1 tablet (600 mg total) by mouth 2 (two) times daily. Patient taking differently: Take 600 mg by mouth 2 (two) times daily. Taking once a day 05/25/19   Palumbo, April, MD  ibuprofen (ADVIL,MOTRIN) 200 MG tablet Take 200-400 mg by mouth every 6 (six) hours as needed for headache or moderate pain.    [provider]  insulin aspart (NOVOLOG) 100 UNIT/ML injection Inject into the skin 3 (three) times daily before meals.    [provider]  Insulin Human (INSULIN PUMP) SOLN Inject into the skin. Pump in left arm--Humalog.    [provider]  levothyroxine (SYNTHROID, LEVOTHROID) 175 MCG tablet Take 175 mcg by mouth at bedtime.     [provider]  lidocaine (XYLOCAINE) 2 % solution 5-15 mL gargle as needed 03/03/20   Cathie Hoops, Amy V, PA-C  rosuvastatin (CRESTOR) 40 MG tablet Take 40 mg by mouth at bedtime.    [provider]  venlafaxine (EFFEXOR) 100 MG tablet Take 100 mg by mouth 2 (two) times daily.    [provider]  sertraline (ZOLOFT) 100 MG tablet Take 2 tablets (  200 mg total) by mouth daily. Patient not taking: Reported on 07/23/2014 06/17/12 03/03/20  Sherren Mocha, MD    Family History Family History  Problem Relation Age of Onset  . Cancer Mother   . Heart disease Father   . Hypertension Father   . Diabetes Father     Social History Social History   Tobacco Use  . Smoking status: Current Every Day Smoker    Packs/day: 1.00    Years: 20.00    Pack years: 20.00    Types: Cigarettes  . Smokeless tobacco: Never Used  Vaping Use  . Vaping Use: Never used  Substance Use Topics  . Alcohol use: Yes    Comment: weekly  . Drug use: No     Allergies   Imitrex [sumatriptan] and Triptans   Review of Systems Review of Systems  Constitutional: Negative for fatigue and fever.  HENT:  Positive for sinus pressure, sinus pain, sore throat and trouble swallowing. Negative for dental problem, ear pain and voice change.   Eyes: Negative for pain, redness and visual disturbance.  Respiratory: Negative for cough and shortness of breath.   Cardiovascular: Negative for chest pain and palpitations.  Gastrointestinal: Negative for abdominal pain, diarrhea and vomiting.  Musculoskeletal: Negative for arthralgias and myalgias.  Skin: Negative for rash and wound.  Neurological: Negative for syncope and headaches.  All other systems reviewed and are negative.    Physical Exam Triage Vital Signs ED Triage Vitals  Enc Vitals Group     BP 03/09/20 1831 128/85     Pulse Rate 03/09/20 1831 83     Resp 03/09/20 1831 18     Temp 03/09/20 1831 97.9 F (36.6 C)     Temp Source 03/09/20 1831 Oral     SpO2 03/09/20 1831 96 %     Weight --      Height --      Head Circumference --      Peak Flow --      Pain Score 03/09/20 1833 0     Pain Loc --      Pain Edu? --      Excl. in GC? --    No data found.  Updated Vital Signs BP 128/85 (BP Location: Right Arm)   Pulse 83   Temp 97.9 F (36.6 C) (Oral)   Resp 18   SpO2 96%   Visual Acuity Right Eye Distance:   Left Eye Distance:   Bilateral Distance:    Right Eye Near:   Left Eye Near:    Bilateral Near:     Physical Exam Constitutional:      General: She is not in acute distress.    Appearance: She is not ill-appearing or diaphoretic.  HENT:     Head: Normocephalic and atraumatic.     Mouth/Throat:     Mouth: Mucous membranes are moist.     Pharynx: Oropharynx is clear. No oropharyngeal exudate or posterior oropharyngeal erythema.  Eyes:     General: No scleral icterus.    Conjunctiva/sclera: Conjunctivae normal.     Pupils: Pupils are equal, round, and reactive to light.  Neck:     Comments: Trachea midline, negative JVD Cardiovascular:     Rate and Rhythm: Normal rate and regular rhythm.     Heart sounds: No  murmur heard.  No gallop.   Pulmonary:     Effort: Pulmonary effort is normal. No respiratory distress.     Breath sounds: No wheezing, rhonchi  or rales.  Musculoskeletal:     Cervical back: Neck supple. No tenderness.  Lymphadenopathy:     Cervical: No cervical adenopathy.  Skin:    Capillary Refill: Capillary refill takes less than 2 seconds.     Coloration: Skin is not jaundiced or pale.     Findings: No rash.  Neurological:     General: No focal deficit present.     Mental Status: She is alert and oriented to person, place, and time.      UC Treatments / Results  Labs (all labs ordered are listed, but only abnormal results are displayed) Labs Reviewed - No data to display  EKG   Radiology No results found.  Procedures Procedures (including critical care time)  Medications Ordered in UC Medications  methylPREDNISolone sodium succinate (SOLU-MEDROL) 125 mg/2 mL injection 125 mg (125 mg Intramuscular Given 03/09/20 1937)    Initial Impression / Assessment and Plan / UC Course  I have reviewed the triage vital signs and the nursing notes.  Pertinent labs & imaging results that were available during my care of the patient were reviewed by me and considered in my medical decision making (see chart for details).     Exam unremarkable today.  Patient already had negative strep and Covid test.  Will cover for bacterial process with Augmentin, follow-up with ENT in 1 week for persistent symptoms.  Return precautions discussed, pt verbalized understanding and is agreeable to plan. Final Clinical Impressions(s) / UC Diagnoses   Final diagnoses:  Sore throat  Sinus congestion   Discharge Instructions   None    ED Prescriptions    Medication Sig Dispense Auth. Provider   amoxicillin-clavulanate (AUGMENTIN) 875-125 MG tablet Take 1 tablet by mouth every 12 (twelve) hours. 14 tablet Hall-Potvin, [place], PA-C   fluconazole (DIFLUCAN) 150 MG tablet Take 1 tablet (150  mg total) by mouth daily. May repeat in 72 hours if needed 2 tablet Hall-Potvin, [place], PA-C     PDMP not reviewed this encounter.   Odette Fraction [place], [place] 03/10/20 [phone]

## 2020-06-02 ENCOUNTER — Other Ambulatory Visit: Payer: Self-pay

## 2020-06-02 ENCOUNTER — Ambulatory Visit
Admission: EM | Admit: 2020-06-02 | Discharge: 2020-06-02 | Disposition: A | Discharge status: Home / Self Care | Payer: 59 | Attending: Emergency Medicine | Admitting: Emergency Medicine

## 2020-06-02 DIAGNOSIS — U071 COVID-19: Secondary | ICD-10-CM

## 2020-06-02 MED ORDER — KETOROLAC TROMETHAMINE 30 MG/ML IJ SOLN
30.0000 mg | Freq: Once | INTRAMUSCULAR | Status: AC
  Administered 2020-06-02: 30 mg via INTRAMUSCULAR

## 2020-06-02 MED ORDER — MECLIZINE HCL 25 MG PO TABS: 25.0000 mg | ORAL_TABLET | Freq: Three times a day (TID) | ORAL | 0 refills | Status: DC | PRN

## 2020-06-02 MED ORDER — DEXAMETHASONE SODIUM PHOSPHATE 10 MG/ML IJ SOLN
10.0000 mg | Freq: Once | INTRAMUSCULAR | Status: AC
  Administered 2020-06-02: 10 mg via INTRAMUSCULAR

## 2020-06-02 NOTE — ED Triage Notes (Signed)
Pt states covid positive from home test on Monday. States for past 3 days having n/v/d. States took phenergan with no relief. States able to keep some fluid down.

## 2020-06-02 NOTE — ED Provider Notes (Signed)
[name]    [name]: [id number] Arrival date & time: 06/02/20  1850      History   Chief Complaint Chief Complaint  Patient presents with  . Covid Positive  . Vomiting  . Diarrhea    HPI Denise Collins is a 40 y.o. female  With history as below presenting for general evaluation s/p positive home COVID test on Monday.  Patient states that she developed symptoms a day or 2 before that.  For the last 3 days has developed nausea, vomiting, diarrhea Rea.  No hematochezia, melena, severe abdominal pain, biliary or bloody emesis.  Has taken Phenergan without relief.  Has been able to keep down some fluids.  Denying fever, palpitations, chest pain, difficulty breathing, weakness.  Does also endorse frontal headaches that are moderate.  Past Medical History:  Diagnosis Date  . Allergy   . Anemia   . Anxiety   . Asthma   . Depression   . Diabetes mellitus without complication (HCC)   . Migraine   . Thyroid disease     Patient Active Problem List   Diagnosis Date Noted  . Thoracic back sprain 06/04/2012    Past Surgical History:  Procedure Laterality Date  . ABDOMINAL HYSTERECTOMY    . LAPAROSCOPY ABDOMEN DIAGNOSTIC    . TONSILLECTOMY      OB History   No obstetric history on file.      Home Medications    Prior to Admission medications   Medication Sig Start Date End Date Taking? Authorizing Provider  meclizine (ANTIVERT) 25 MG tablet Take 1 tablet (25 mg total) by mouth 3 (three) times daily as needed for dizziness. 06/02/20  Yes Hall-Potvin, [place], PA-C  ALPRAZolam (XANAX PO) Take by mouth.    [provider]  ALPRAZolam Prudy Feeler) 1 MG tablet Take 1 mg by mouth at bedtime.    [provider]  ARIPiprazole (ABILIFY) 10 MG tablet Take 10 mg by mouth daily.    [provider]  ibuprofen (ADVIL,MOTRIN) 200 MG tablet Take 200-400 mg by mouth every 6 (six) hours as needed for headache or moderate pain.    [provider]   insulin aspart (NOVOLOG) 100 UNIT/ML injection Inject into the skin 3 (three) times daily before meals.    [provider]  Insulin Human (INSULIN PUMP) SOLN Inject into the skin. Pump in left arm--Humalog.    [provider]  levothyroxine (SYNTHROID, LEVOTHROID) 175 MCG tablet Take 175 mcg by mouth at bedtime.     [provider]  rosuvastatin (CRESTOR) 40 MG tablet Take 40 mg by mouth at bedtime.    [provider]  venlafaxine (EFFEXOR) 100 MG tablet Take 100 mg by mouth 2 (two) times daily.    [provider]  sertraline (ZOLOFT) 100 MG tablet Take 2 tablets (200 mg total) by mouth daily. Patient not taking: Reported on 07/23/2014 06/17/12 03/03/20  Sherren Mocha, MD    Family History Family History  Problem Relation Age of Onset  . Cancer Mother   . Heart disease Father   . Hypertension Father   . Diabetes Father     Social History Social History   Tobacco Use  . Smoking status: Current Every Day Smoker    Packs/day: 1.00    Years: 20.00    Pack years: 20.00    Types: Cigarettes  . Smokeless tobacco: Never Used  Vaping Use  . Vaping Use: Never used  Substance Use Topics  . Alcohol  use: Yes    Comment: weekly  . Drug use: No     Allergies   Imitrex [sumatriptan] and Triptans   Review of Systems Review of Systems  Constitutional: Positive for activity change, appetite change and fatigue. Negative for fever.  HENT: Negative for congestion, dental problem, ear pain, facial swelling, hearing loss, sinus pain, sore throat, trouble swallowing and voice change.   Eyes: Negative for photophobia, pain and visual disturbance.  Respiratory: Negative for cough and shortness of breath.   Cardiovascular: Negative for chest pain and palpitations.  Gastrointestinal: Positive for diarrhea, nausea and vomiting. Negative for abdominal pain, blood in stool and constipation.  Musculoskeletal: Negative for arthralgias and myalgias.   Neurological: Positive for headaches. Negative for dizziness, weakness, light-headedness and numbness.     Physical Exam Triage Vital Signs ED Triage Vitals  Enc Vitals Group     BP      Pulse      Resp      Temp      Temp src      SpO2      Weight      Height      Head Circumference      Peak Flow      Pain Score      Pain Loc      Pain Edu?      Excl. in GC?    No data found.  Updated Vital Signs BP 116/80   Pulse 93   Temp 98.3 F (36.8 C) (Oral)   Resp 18   SpO2 95%   Visual Acuity Right Eye Distance:   Left Eye Distance:   Bilateral Distance:    Right Eye Near:   Left Eye Near:    Bilateral Near:     Physical Exam Constitutional:      General: She is not in acute distress.    Appearance: She is not ill-appearing or diaphoretic.  HENT:     Head: Normocephalic and atraumatic.     Right Ear: Tympanic membrane and ear canal normal.     Left Ear: Tympanic membrane and ear canal normal.     Mouth/Throat:     Mouth: Mucous membranes are moist.     Pharynx: Oropharynx is clear. No oropharyngeal exudate or posterior oropharyngeal erythema.  Eyes:     General: No scleral icterus.    Conjunctiva/sclera: Conjunctivae normal.     Pupils: Pupils are equal, round, and reactive to light.  Neck:     Comments: Trachea midline, negative JVD Cardiovascular:     Rate and Rhythm: Normal rate and regular rhythm.     Heart sounds: No murmur heard. No gallop.   Pulmonary:     Effort: Pulmonary effort is normal. No respiratory distress.     Breath sounds: No wheezing, rhonchi or rales.  Musculoskeletal:     Cervical back: Neck supple. No tenderness.  Lymphadenopathy:     Cervical: No cervical adenopathy.  Skin:    Capillary Refill: Capillary refill takes less than 2 seconds.     Coloration: Skin is not jaundiced or pale.     Findings: No rash.  Neurological:     General: No focal deficit present.     Mental Status: She is alert and oriented to person, place,  and time.      UC Treatments / Results  Labs (all labs ordered are listed, but only abnormal results are displayed) Labs Reviewed - No data to display  EKG   Radiology  No results found.  Procedures Procedures (including critical care time)  Medications Ordered in UC Medications  ketorolac (TORADOL) 30 MG/ML injection 30 mg (30 mg Intramuscular Given 06/02/20 2009)  dexamethasone (DECADRON) injection 10 mg (10 mg Intramuscular Given 06/02/20 2009)    Initial Impression / Assessment and Plan / UC Course  I have reviewed the triage vital signs and the nursing notes.  Pertinent labs & imaging results that were available during my care of the patient were reviewed by me and considered in my medical decision making (see chart for details).     Afebrile, nontoxic, sedate.  No neurocognitive deficit: Exam reassuring at this time.  Given headache cocktail as above which she tolerated well.  We will try meclizine, follow-up with PCP as needed; push fluids.  Return precautions discussed, pt verbalized understanding and is agreeable to plan. Final Clinical Impressions(s) / UC Diagnoses   Final diagnoses:  COVID-19 virus infection   Discharge Instructions   None    ED Prescriptions    Medication Sig Dispense Auth. Provider   meclizine (ANTIVERT) 25 MG tablet Take 1 tablet (25 mg total) by mouth 3 (three) times daily as needed for dizziness. 30 tablet Hall-Potvin, [place], PA-C     I have reviewed the PDMP during this encounter.   Hall-Potvin, [place], [place] 06/02/20 2012

## 2020-06-15 ENCOUNTER — Ambulatory Visit
Admission: EM | Admit: 2020-06-15 | Discharge: 2020-06-15 | Disposition: A | Discharge status: Home / Self Care | Payer: 59 | Attending: Urgent Care | Admitting: Urgent Care

## 2020-06-15 ENCOUNTER — Ambulatory Visit (INDEPENDENT_AMBULATORY_CARE_PROVIDER_SITE_OTHER): Payer: 59

## 2020-06-15 ENCOUNTER — Other Ambulatory Visit: Payer: Self-pay

## 2020-06-15 DIAGNOSIS — R059 Cough, unspecified: Secondary | ICD-10-CM

## 2020-06-15 DIAGNOSIS — E1165 Type 2 diabetes mellitus with hyperglycemia: Secondary | ICD-10-CM

## 2020-06-15 DIAGNOSIS — Z8616 Personal history of COVID-19: Secondary | ICD-10-CM

## 2020-06-15 DIAGNOSIS — R079 Chest pain, unspecified: Secondary | ICD-10-CM | POA: Diagnosis not present

## 2020-06-15 DIAGNOSIS — Z8709 Personal history of other diseases of the respiratory system: Secondary | ICD-10-CM

## 2020-06-15 DIAGNOSIS — F17200 Nicotine dependence, unspecified, uncomplicated: Secondary | ICD-10-CM

## 2020-06-15 IMAGING — DX DG CHEST 2V
2 series · 2 of 2 positions shown · non-contrast
Comparison: [DATE]

CLINICAL DATA: Chest pain and cough

EXAM:
CHEST - 2 VIEW

[chest pa]
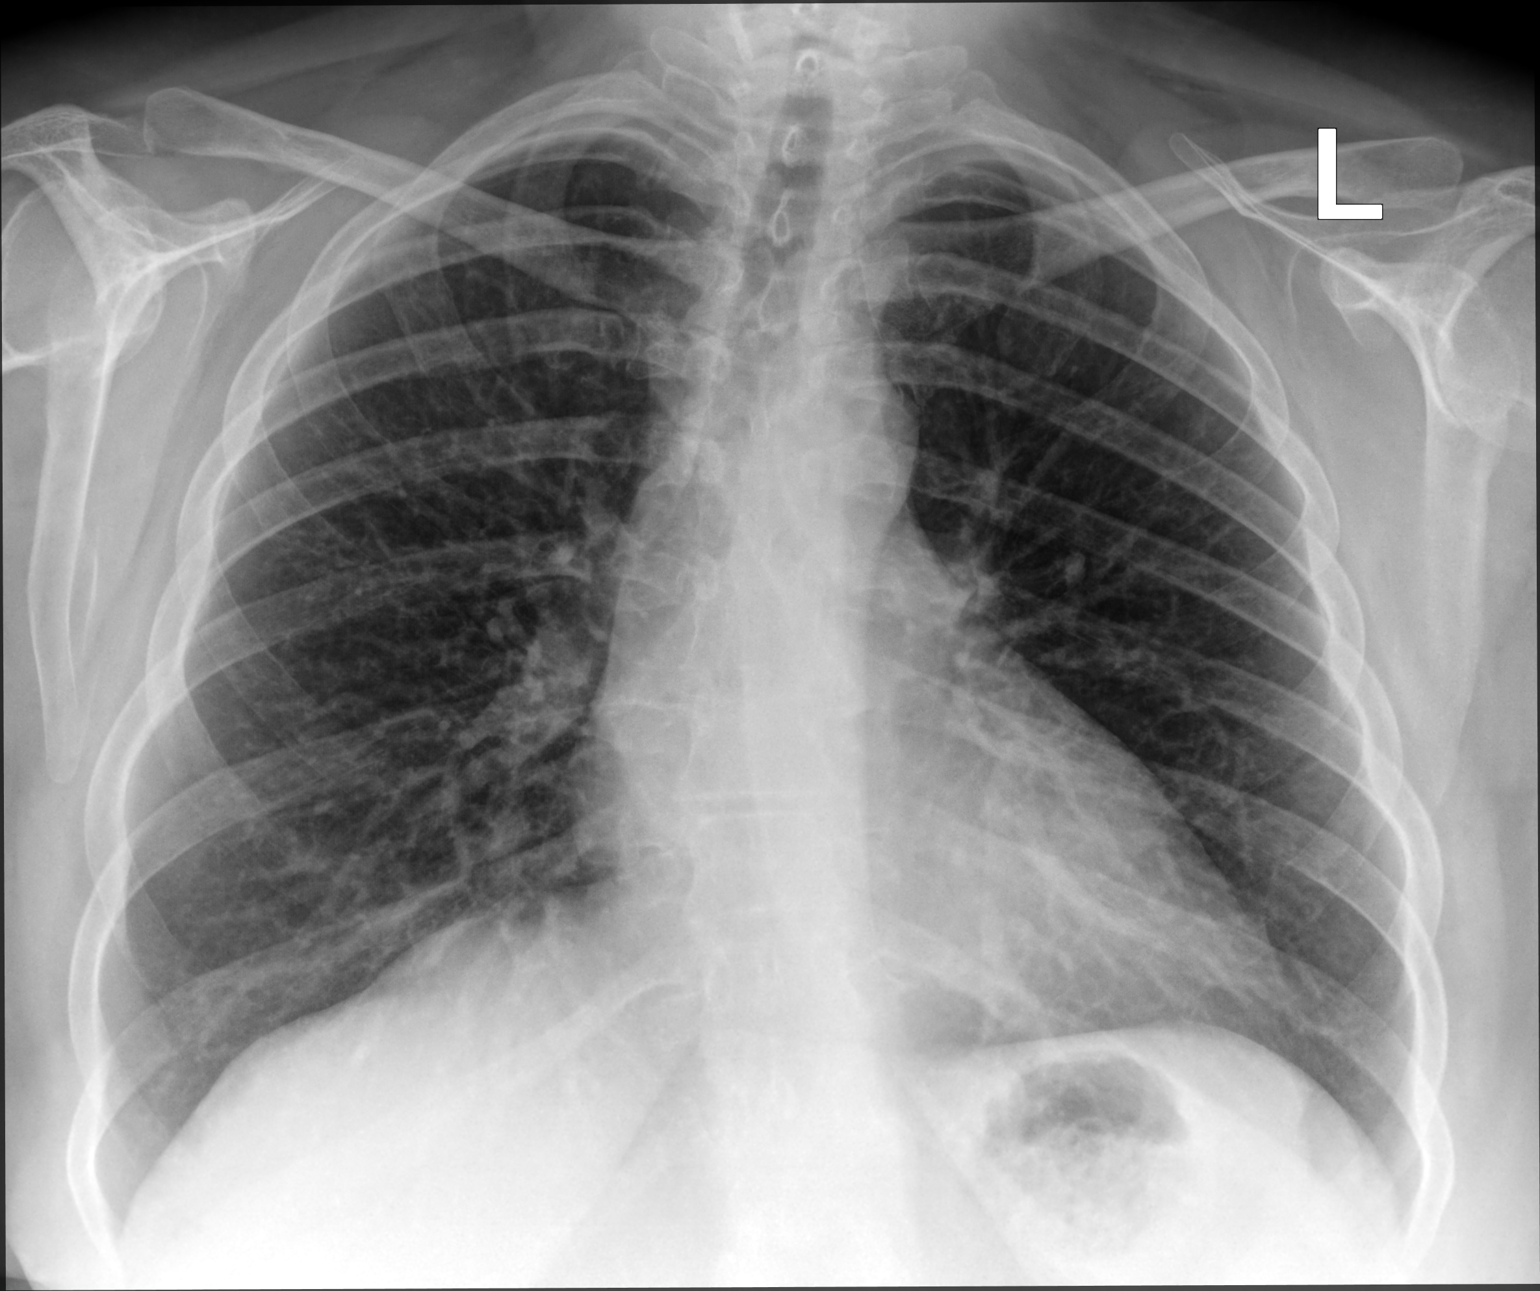

[chest lat]
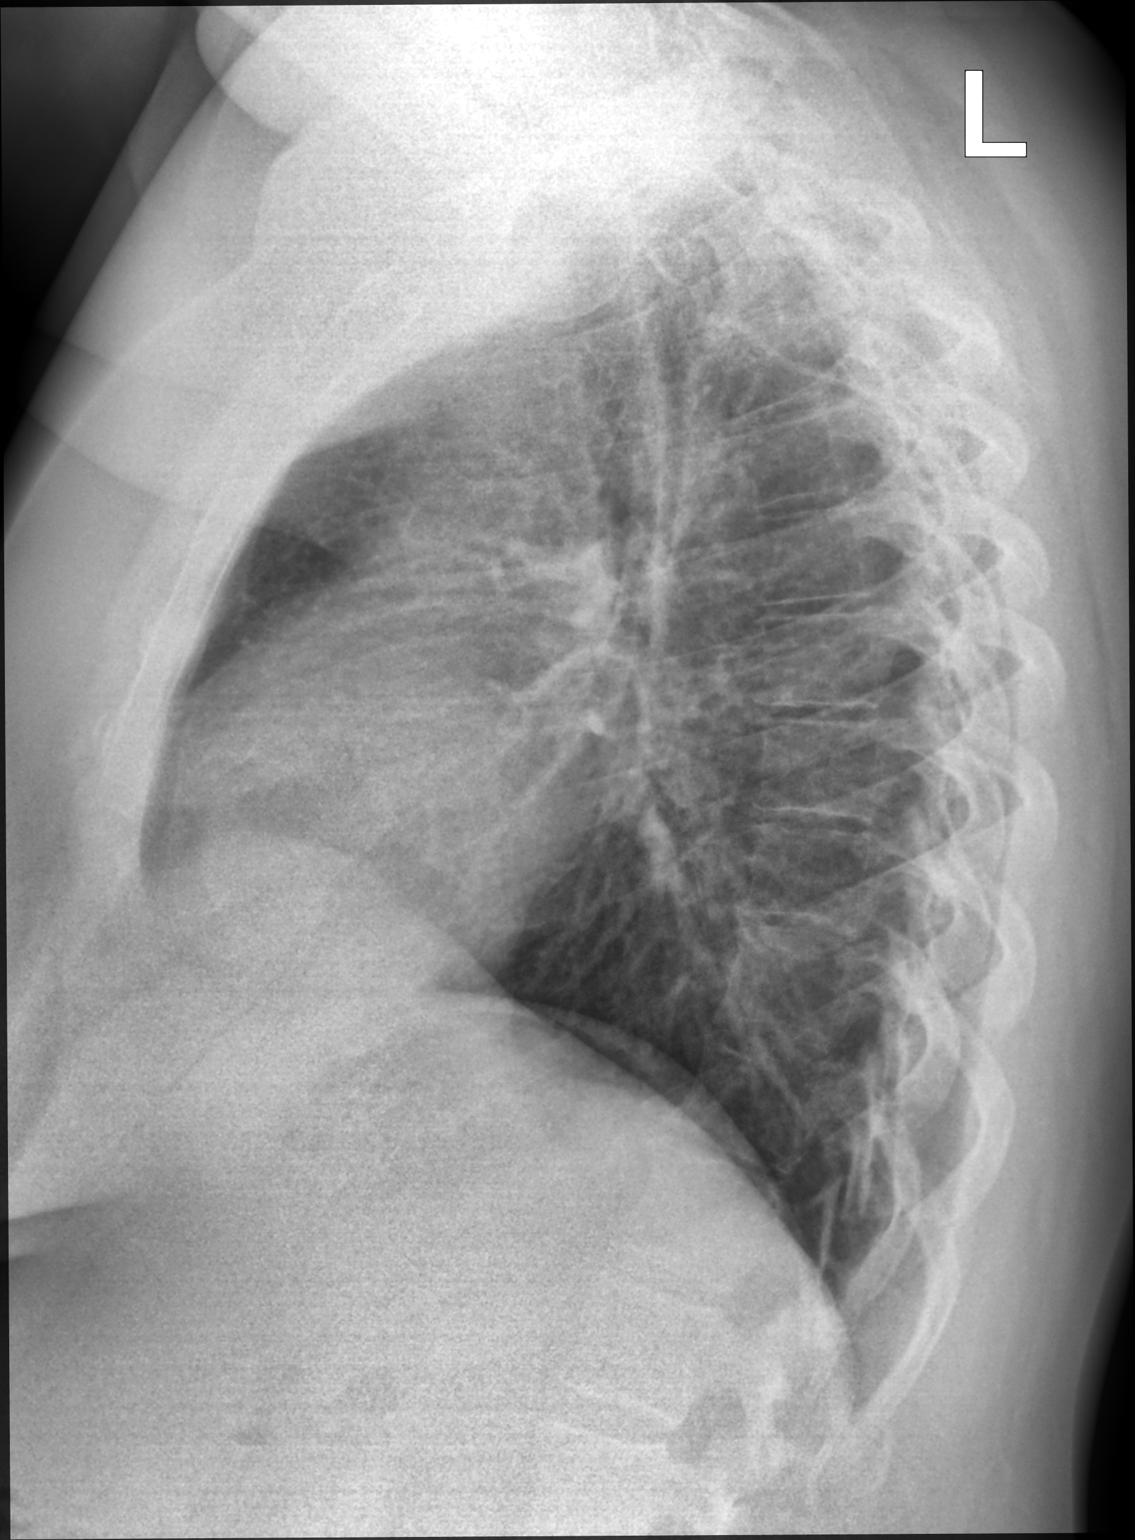

[2 of 2 positions shown; findings below may reference images not displayed]

FINDINGS: Normal heart size and mediastinal contours. No acute infiltrate or
edema. No effusion or pneumothorax. No acute osseous findings.
IMPRESSION: Negative chest.

## 2020-06-15 MED ORDER — PROMETHAZINE-DM 6.25-15 MG/5ML PO SYRP: 5.0000 mL | ORAL_SOLUTION | Freq: Every evening | ORAL | 0 refills | Status: DC | PRN

## 2020-06-15 NOTE — ED Triage Notes (Signed)
Patient states she has had difficulty breathing, cough, congestion, and difficulty sleeping x 1-2 weeks. Pt had a positive covid test about 3 weeks ago. Pt is aox4. Pt is speaking in full sentences with good capillary refill. Pt is aox4 and ambulatory.

## 2020-06-15 NOTE — ED Provider Notes (Signed)
[facility]   MRN: [id number] DOB: Jan 21, 1981  Subjective:   Denise Collins is a 40 y.o. female presenting for 1 to 2-week history of persistent cough, midsternal chest pain, congestion, shortness of breath, malaise and fatigue.  Patient tested positive for COVID-19 3 weeks ago.  She does have a history of asthma and was recently prescribed an albuterol inhaler and another inhaler at that patient cannot recall the name of.  She is an active smoker, does 1 pack a day.  She also has uncontrolled diabetes.  Has OSA.  No current facility-administered medications for this encounter.  Current Outpatient Medications:  .  ALPRAZolam (XANAX) 1 MG tablet, Take 1 mg by mouth at bedtime., Disp: , Rfl:  .  ARIPiprazole (ABILIFY) 10 MG tablet, Take 10 mg by mouth daily., Disp: , Rfl:  .  ibuprofen (ADVIL,MOTRIN) 200 MG tablet, Take 200-400 mg by mouth every 6 (six) hours as needed for headache or moderate pain., Disp: , Rfl:  .  insulin aspart (NOVOLOG) 100 UNIT/ML injection, Inject into the skin 3 (three) times daily before meals., Disp: , Rfl:  .  Insulin Human (INSULIN PUMP) SOLN, Inject into the skin. Pump in left arm--Humalog., Disp: , Rfl:  .  levothyroxine (SYNTHROID, LEVOTHROID) 175 MCG tablet, Take 175 mcg by mouth at bedtime. , Disp: , Rfl:  .  meclizine (ANTIVERT) 25 MG tablet, Take 1 tablet (25 mg total) by mouth 3 (three) times daily as needed for dizziness., Disp: 30 tablet, Rfl: 0 .  rosuvastatin (CRESTOR) 40 MG tablet, Take 40 mg by mouth at bedtime., Disp: , Rfl:  .  venlafaxine (EFFEXOR) 100 MG tablet, Take 100 mg by mouth 2 (two) times daily., Disp: , Rfl:  .  ALPRAZolam (XANAX PO), Take by mouth., Disp: , Rfl:    Allergies  Allergen Reactions  . Imitrex [Sumatriptan]     Worsens headache  . Triptans     Worsens headache    Past Medical History:  Diagnosis Date  . Allergy   . Anemia   . Anxiety   . Asthma   . Depression   . Diabetes mellitus without  complication (HCC)   . Migraine   . Thyroid disease      Past Surgical History:  Procedure Laterality Date  . ABDOMINAL HYSTERECTOMY    . LAPAROSCOPY ABDOMEN DIAGNOSTIC    . TONSILLECTOMY      Family History  Problem Relation Age of Onset  . Cancer Mother   . Heart disease Father   . Hypertension Father   . Diabetes Father     Social History   Tobacco Use  . Smoking status: Current Every Day Smoker    Packs/day: 1.00    Years: 20.00    Pack years: 20.00    Types: Cigarettes  . Smokeless tobacco: Never Used  Vaping Use  . Vaping Use: Never used  Substance Use Topics  . Alcohol use: Yes    Comment: weekly  . Drug use: No    ROS   Objective:   Vitals: BP 122/83 (BP Location: Left Arm)   Pulse 81   Temp 98 F (36.7 C) (Oral)   Resp 19   SpO2 98%   Physical Exam Constitutional:      General: She is not in acute distress.    Appearance: Normal appearance. She is well-developed. She is obese. She is not ill-appearing, toxic-appearing or diaphoretic.  HENT:     Head: Normocephalic and atraumatic.  Nose: Nose normal.     Mouth/Throat:     Mouth: Mucous membranes are moist.  Eyes:     Extraocular Movements: Extraocular movements intact.     Pupils: Pupils are equal, round, and reactive to light.  Cardiovascular:     Rate and Rhythm: Normal rate and regular rhythm.     Pulses: Normal pulses.     Heart sounds: Normal heart sounds. No murmur heard. No friction rub. No gallop.   Pulmonary:     Effort: Pulmonary effort is normal. No respiratory distress.     Breath sounds: Normal breath sounds. No stridor. No wheezing, rhonchi or rales.  Skin:    General: Skin is warm and dry.     Findings: No rash.  Neurological:     Mental Status: She is alert and oriented to person, place, and time.  Psychiatric:        Mood and Affect: Mood normal.        Behavior: Behavior normal.        Thought Content: Thought content normal.     DG Chest 2 View  Result  Date: 06/15/2020 CLINICAL DATA:  Chest pain and cough EXAM: CHEST - 2 VIEW COMPARISON:  05/31/2019 FINDINGS: Normal heart size and mediastinal contours. No acute infiltrate or edema. No effusion or pneumothorax. No acute osseous findings. IMPRESSION: Negative chest. Electronically Signed   By: Marnee Spring M.D.   On: 06/15/2020 11:08     Assessment and Plan :   PDMP not reviewed this encounter.  1. Cough   2. Smoker   3. History of COVID-19   4. History of asthma   5. Uncontrolled type 2 diabetes mellitus with hyperglycemia [facility])     Patient has clear cardiopulmonary exam, negative chest x-ray.  She is an uncontrolled diabetic and therefore counseled against using prednisone.  Recommended quitting smoking.  Continue using inhalers as prescribed by her primary care provider.  Use supportive care otherwise. Counseled patient on potential for adverse effects with medications prescribed/recommended today, ER and return-to-clinic precautions discussed, patient verbalized understanding.    Wallis Bamberg, PA-C 06/15/20 1143

## 2020-09-26 ENCOUNTER — Other Ambulatory Visit: Payer: Self-pay | Admitting: Obstetrics and Gynecology

## 2020-09-26 DIAGNOSIS — R928 Other abnormal and inconclusive findings on diagnostic imaging of breast: Secondary | ICD-10-CM

## 2020-10-14 ENCOUNTER — Other Ambulatory Visit: Payer: Self-pay

## 2020-10-14 ENCOUNTER — Ambulatory Visit
Admission: RE | Admit: 2020-10-14 | Discharge: 2020-10-14 | Disposition: A | Discharge status: Home / Self Care | Payer: 59 | Source: Ambulatory Visit | Attending: Obstetrics and Gynecology | Admitting: Obstetrics and Gynecology

## 2020-10-14 ENCOUNTER — Ambulatory Visit: Payer: 59

## 2020-10-14 DIAGNOSIS — R928 Other abnormal and inconclusive findings on diagnostic imaging of breast: Secondary | ICD-10-CM

## 2020-10-14 IMAGING — MG MM DIGITAL DIAGNOSTIC UNILAT*L* W/ TOMO W/ CAD
4 series · 4 of 12 positions shown · non-contrast
Comparison: Previous exam(s).

CLINICAL DATA: 39-year-old female for further evaluation of
possible LEFT breast asymmetry on screening mammogram.

EXAM:
DIGITAL DIAGNOSTIC UNILATERAL LEFT MAMMOGRAM WITH TOMOSYNTHESIS AND
CAD
TECHNIQUE: Left digital diagnostic mammography and breast tomosynthesis was
performed. The images were evaluated with computer-aided detection.

[L CC synth-2D]
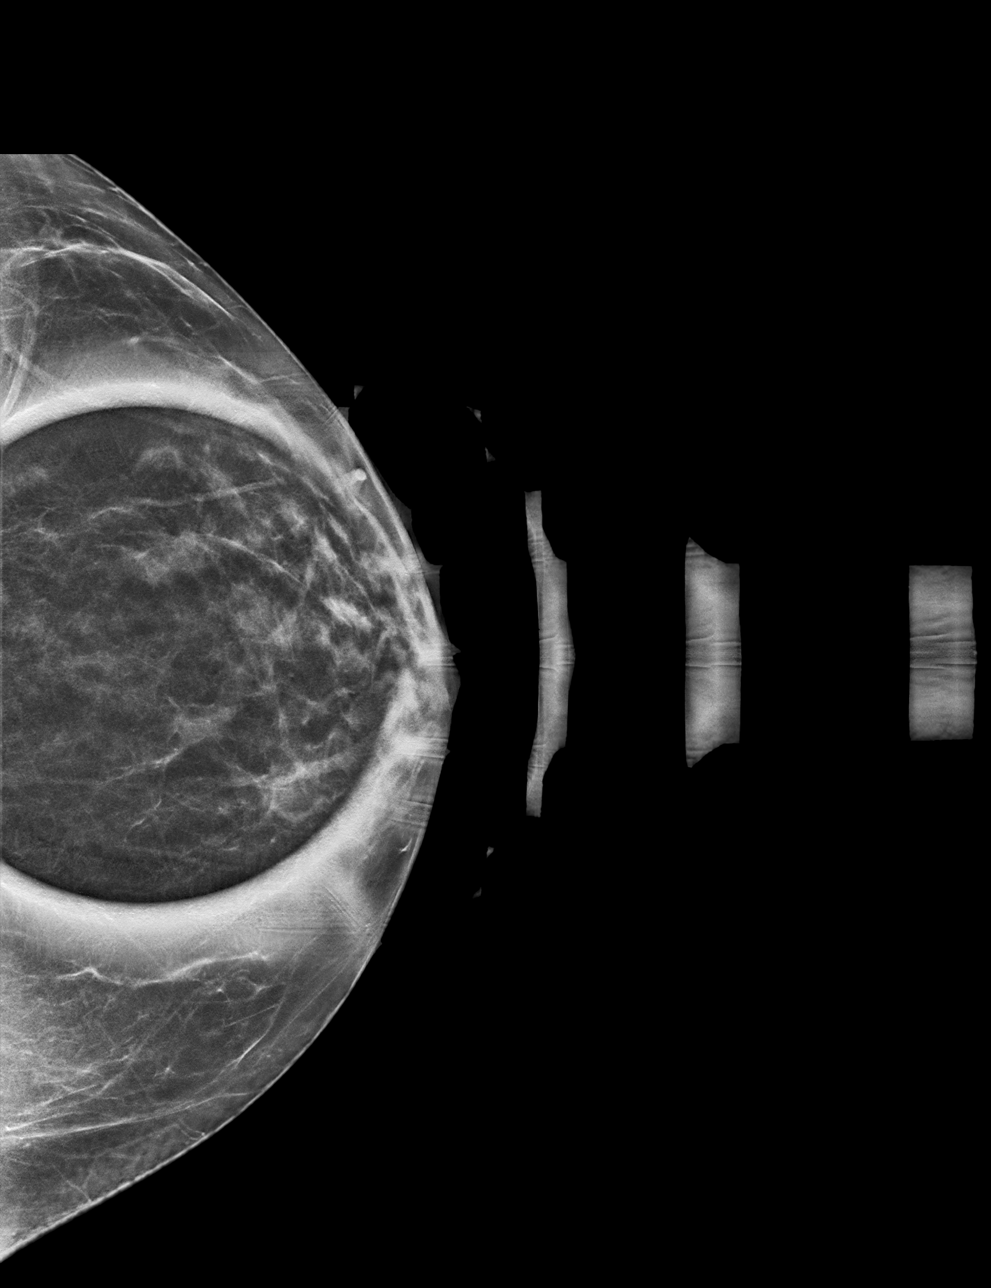

[L MLO synth-2D]
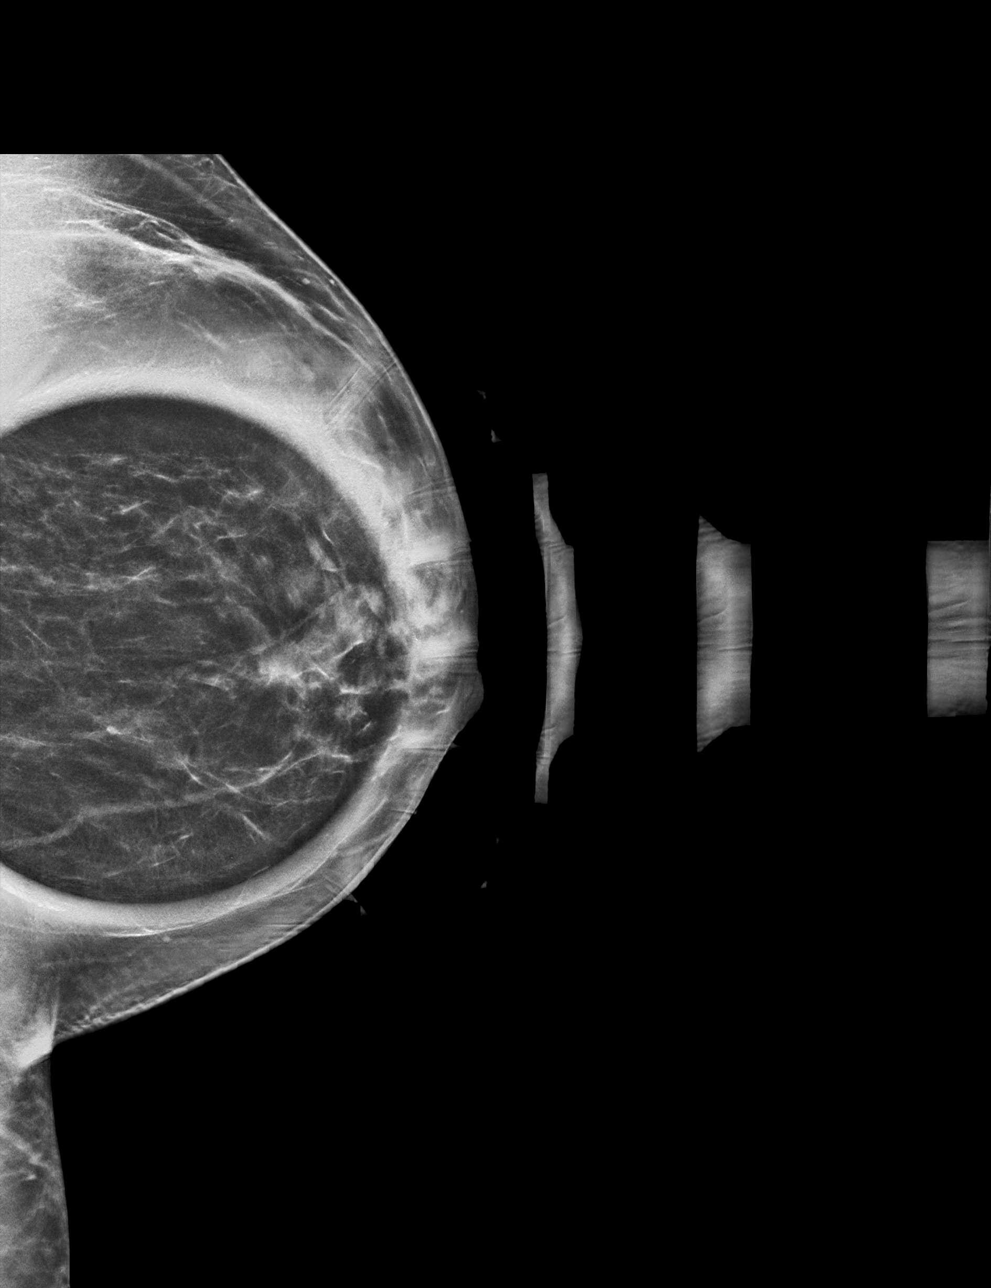

[L MLO tomo · tomo slice 31/61.0]
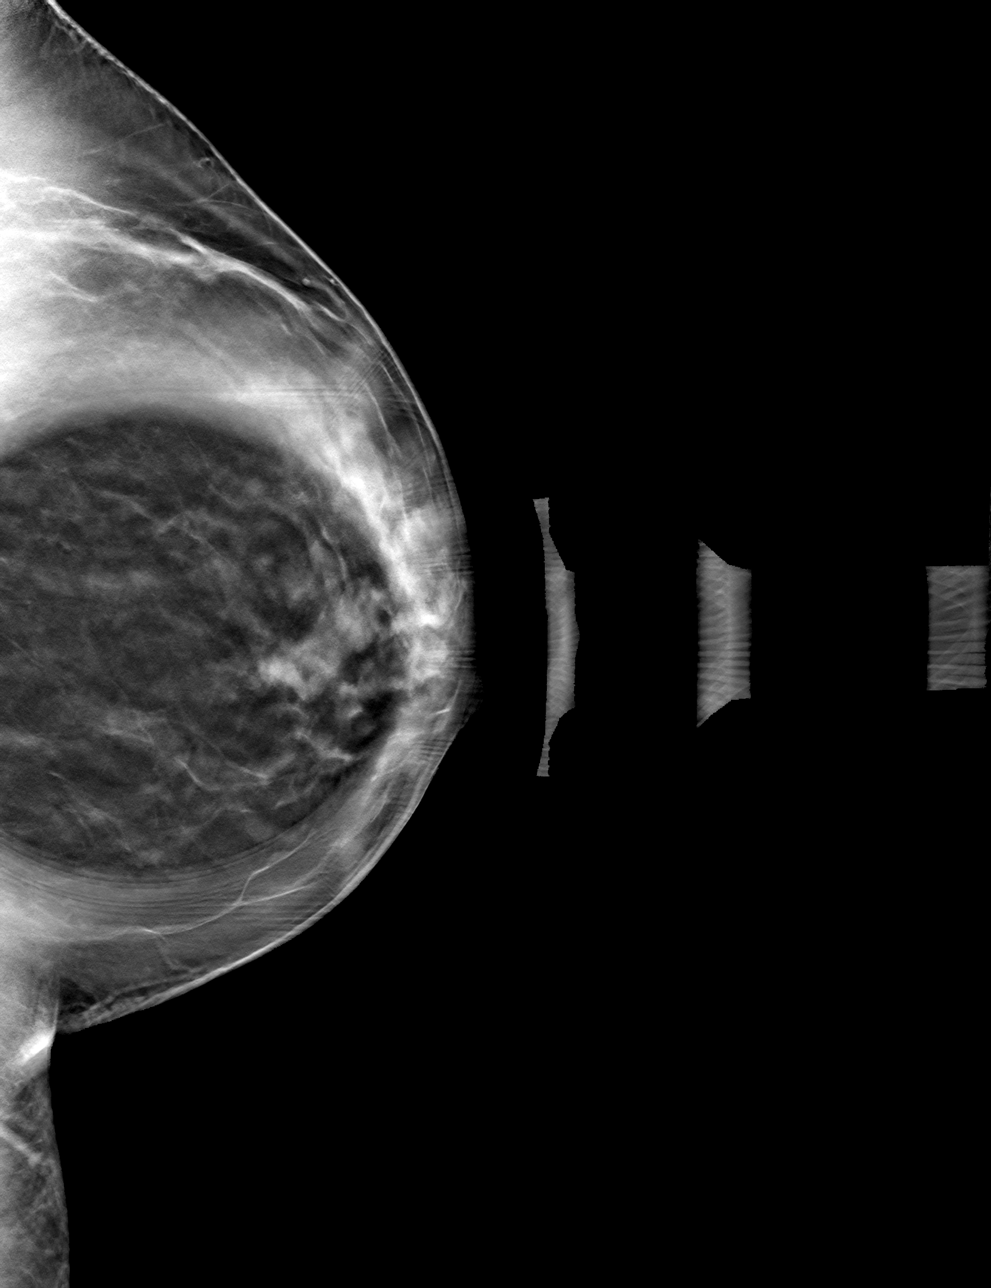

[L CC tomo · tomo slice 25/49.0]
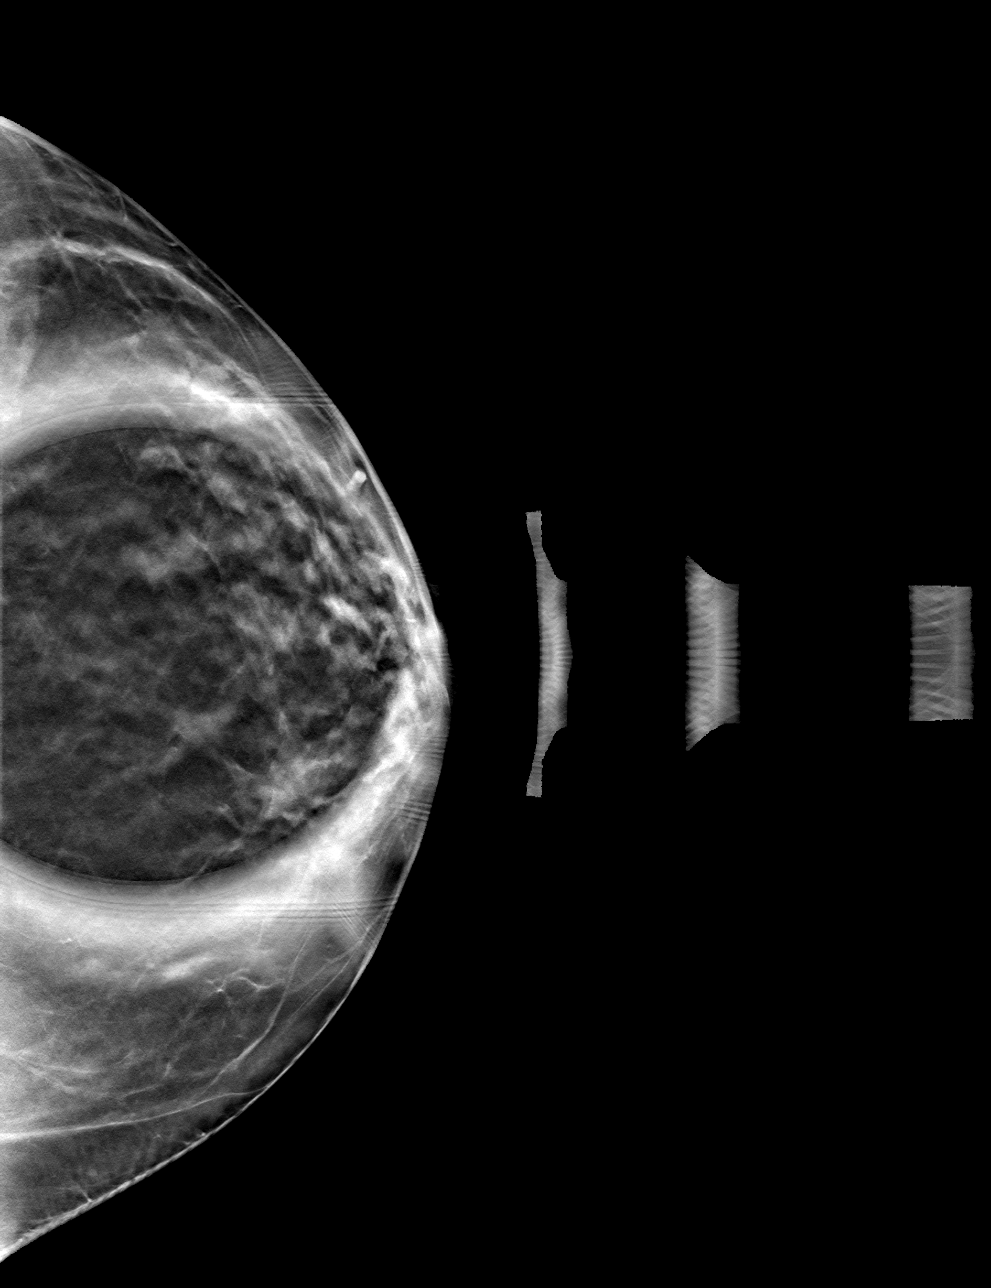

[4 of 12 positions shown; findings below may reference images not displayed]

ACR Breast Density Category b: There are scattered areas of
fibroglandular density.
FINDINGS: 2D/3D spot compression views of the LEFT breast demonstrate
dispersal of the screening study asymmetry. No persistent suspicious
abnormality in this area identified.
IMPRESSION: No persistent suspicious abnormality at the site of the screening
study finding.

RECOMMENDATION:
Bilateral screening mammogram in 1 year.

I have discussed the findings and recommendations with the patient.
If applicable, a reminder letter will be sent to the patient
regarding the next appointment.

BI-RADS CATEGORY  1: Negative.

## 2021-02-21 ENCOUNTER — Encounter (HOSPITAL_COMMUNITY): Payer: Self-pay | Admitting: Urgent Care

## 2021-02-21 ENCOUNTER — Ambulatory Visit (INDEPENDENT_AMBULATORY_CARE_PROVIDER_SITE_OTHER): Payer: 59

## 2021-02-21 ENCOUNTER — Ambulatory Visit (HOSPITAL_COMMUNITY)
Admission: EM | Admit: 2021-02-21 | Discharge: 2021-02-21 | Disposition: A | Discharge status: Home / Self Care | Payer: 59 | Attending: Urgent Care | Admitting: Urgent Care

## 2021-02-21 ENCOUNTER — Other Ambulatory Visit: Payer: Self-pay

## 2021-02-21 DIAGNOSIS — M5412 Radiculopathy, cervical region: Secondary | ICD-10-CM | POA: Diagnosis not present

## 2021-02-21 DIAGNOSIS — E109 Type 1 diabetes mellitus without complications: Secondary | ICD-10-CM

## 2021-02-21 DIAGNOSIS — R202 Paresthesia of skin: Secondary | ICD-10-CM

## 2021-02-21 DIAGNOSIS — E1065 Type 1 diabetes mellitus with hyperglycemia: Secondary | ICD-10-CM

## 2021-02-21 DIAGNOSIS — M419 Scoliosis, unspecified: Secondary | ICD-10-CM

## 2021-02-21 DIAGNOSIS — R200 Anesthesia of skin: Secondary | ICD-10-CM

## 2021-02-21 LAB — CBG MONITORING, ED: Glucose-Capillary: 282 mg/dL — ABNORMAL HIGH (ref 70–99)

## 2021-02-21 IMAGING — DX DG CERVICAL SPINE COMPLETE 4+V
5 series · 5 of 5 positions shown · non-contrast
Comparison: None.

CLINICAL DATA: numbness and tingling of both hands

EXAM:
CERVICAL SPINE - COMPLETE 4+ VIEW

[c-spine lat]
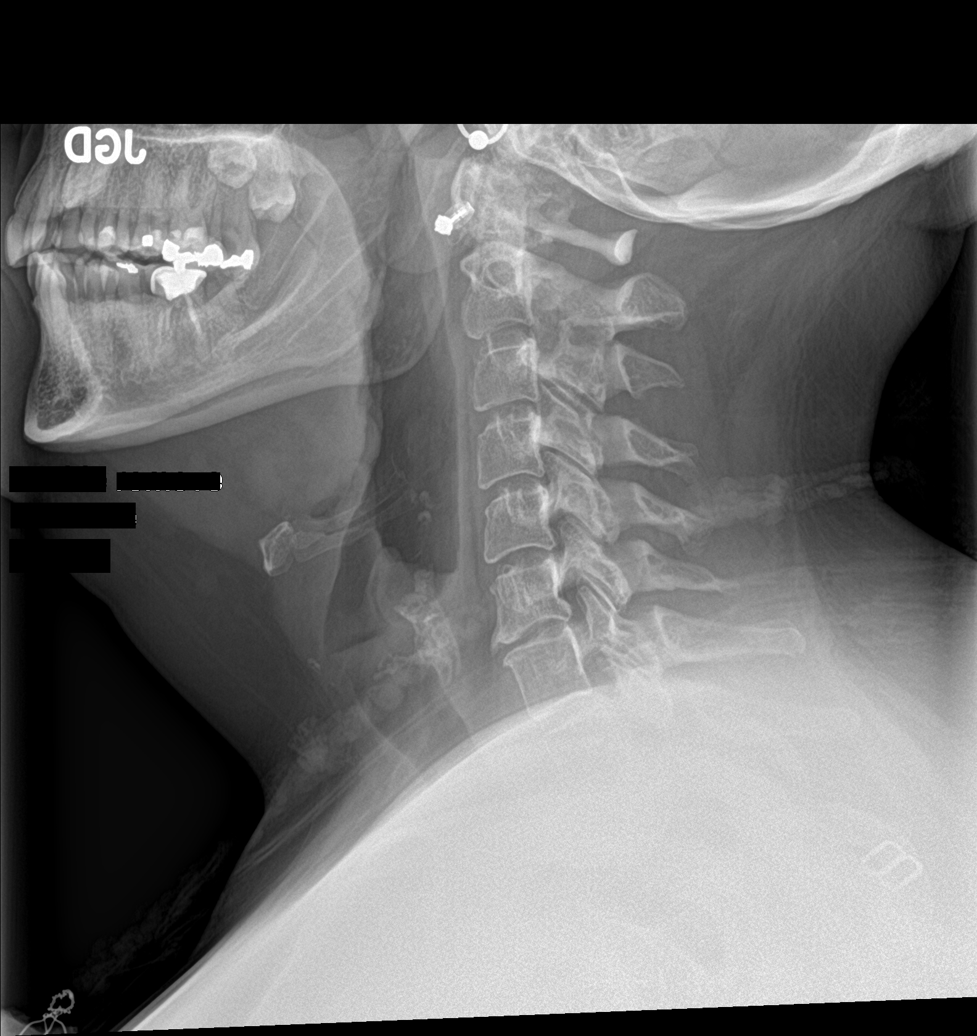

[c-spine obl (1 of 2)]
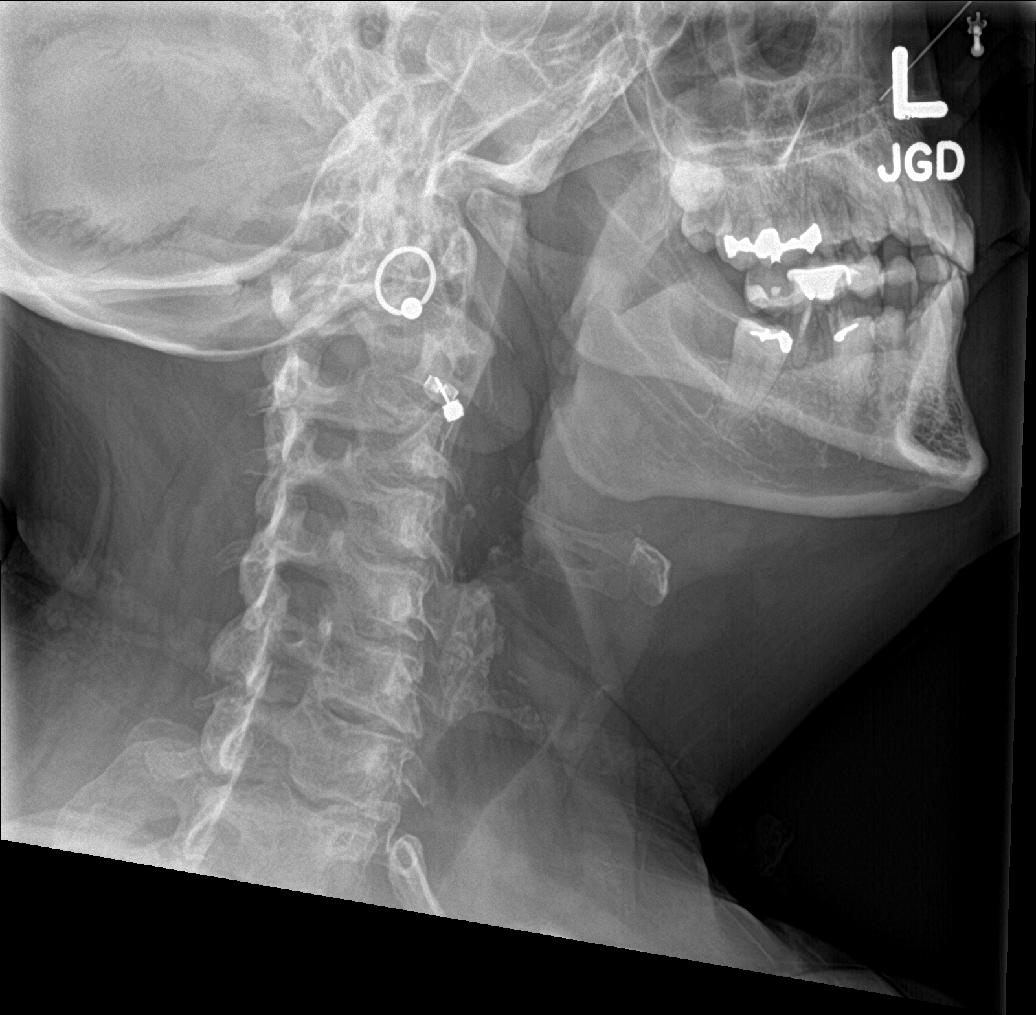

[c-spine obl (2 of 2)]
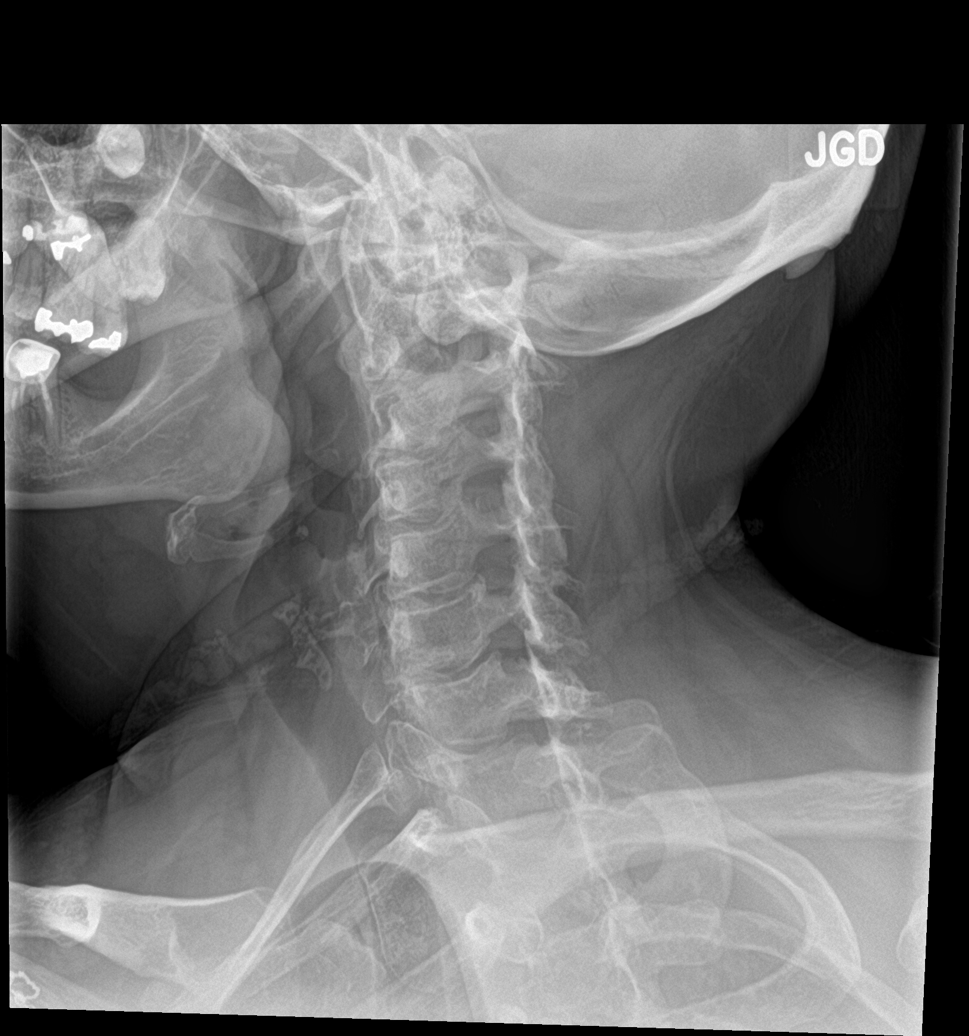

[c-spine ap]
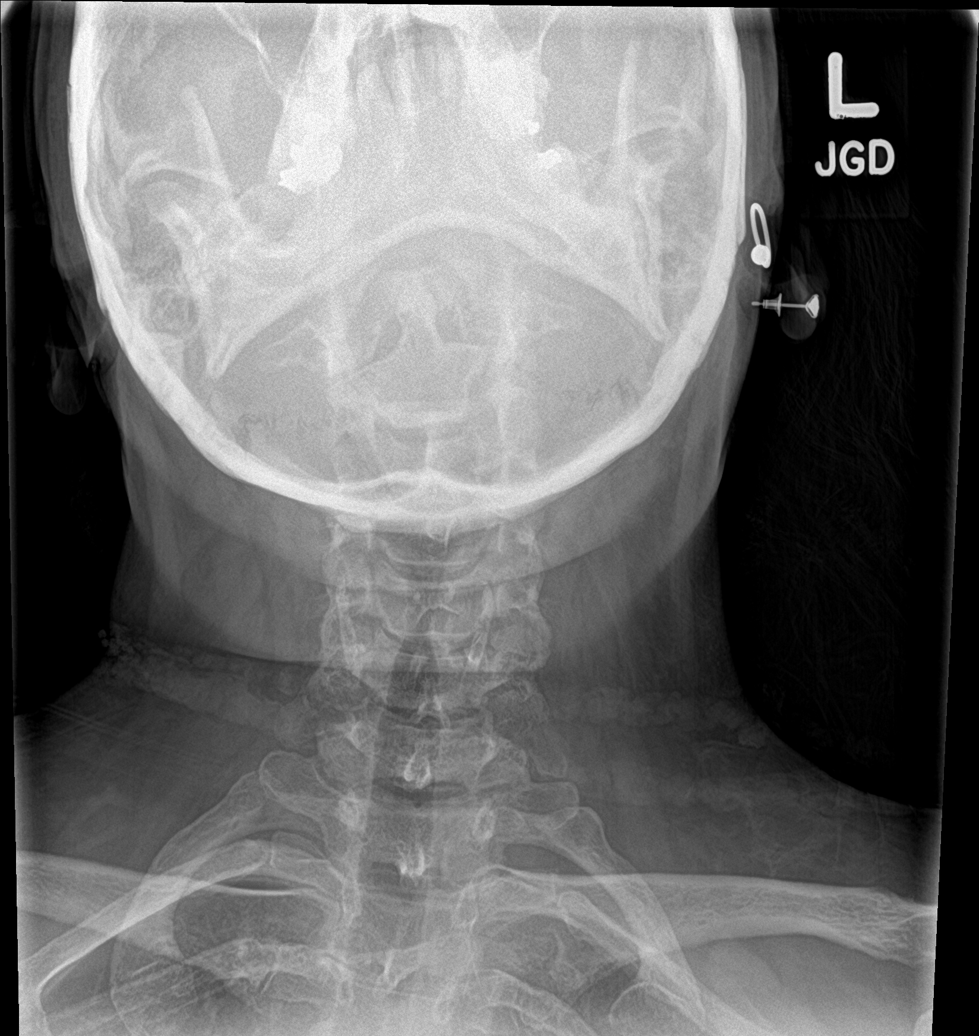

[c-spine open mouth]
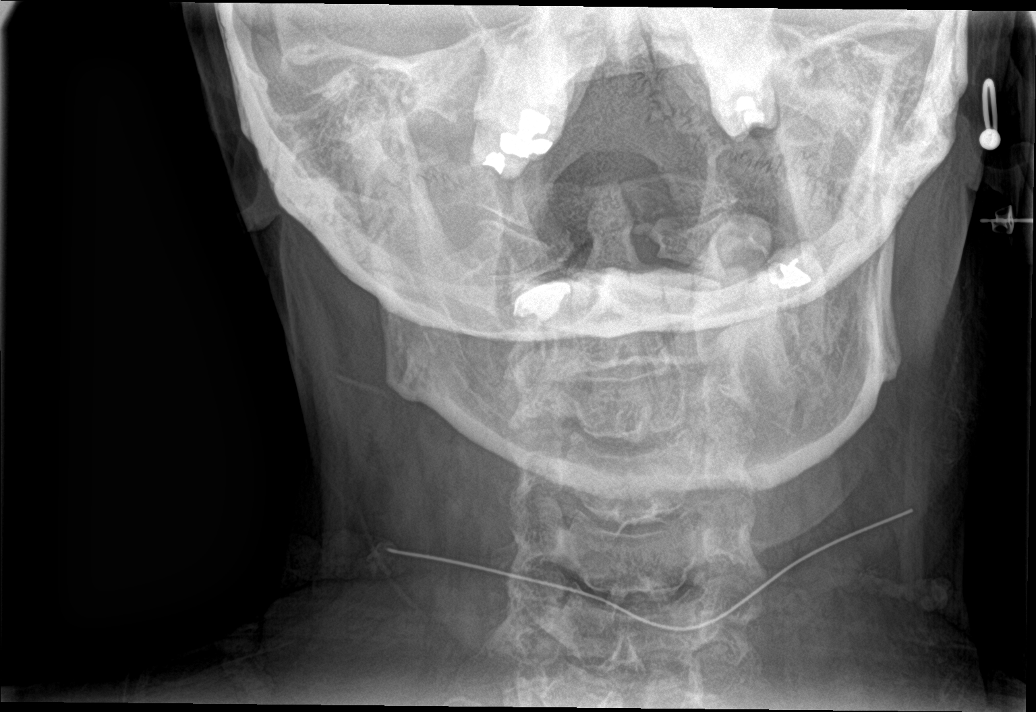

[5 of 5 positions shown; findings below may reference images not displayed]

FINDINGS: There is no radiographically evident cervical spine fracture. There
is moderate disc height loss at C6-C7. There is mild facet
arthropathy at C7-T1.
IMPRESSION: Moderate disc height loss at C6-C7. Mild facet arthropathy at C7-T1.

## 2021-02-21 MED ORDER — PREDNISONE 50 MG PO TABS: 50.0000 mg | ORAL_TABLET | Freq: Every day | ORAL | 0 refills | Status: DC

## 2021-02-21 NOTE — ED Triage Notes (Signed)
Pt is present today with bilateral numbness and tingling in her hands and arms. Pt states that pain is radiating her up going to her shoulder blades. Pt states that her pain started a few weeks ago.

## 2021-02-21 NOTE — ED Provider Notes (Signed)
[facility]   MRN: [id number] DOB: [birth year]  Subjective:   Denise Collins is a 40 y.o. female presenting for several week history of persistent and progressively worsening bilateral pain, numbness and tingling over the radial aspect of her hands radiating upwards toward her neck.  Patient has a history of scoliosis of the thoracic vertebra.  Denies any particular fall, trauma, neck pain, weakness.  Patient does have diabetes, states that her blood sugars have been better.  Unfortunately, just prior to her blood sugar check she had eaten while waiting.  Has type 1 diabetes and her blood sugars have improved dramatically.  States that her A1c is about 8%.  Last year was greater than 12%.  No current facility-administered medications for this encounter.  Current Outpatient Medications:    ALPRAZolam (XANAX PO), Take by mouth., Disp: , Rfl:    ALPRAZolam (XANAX) 1 MG tablet, Take 1 mg by mouth at bedtime., Disp: , Rfl:    ARIPiprazole (ABILIFY) 10 MG tablet, Take 10 mg by mouth daily., Disp: , Rfl:    ibuprofen (ADVIL,MOTRIN) 200 MG tablet, Take 200-400 mg by mouth every 6 (six) hours as needed for headache or moderate pain., Disp: , Rfl:    insulin aspart (NOVOLOG) 100 UNIT/ML injection, Inject into the skin 3 (three) times daily before meals., Disp: , Rfl:    Insulin Human (INSULIN PUMP) SOLN, Inject into the skin. Pump in left arm--Humalog., Disp: , Rfl:    levothyroxine (SYNTHROID, LEVOTHROID) 175 MCG tablet, Take 175 mcg by mouth at bedtime. , Disp: , Rfl:    meclizine (ANTIVERT) 25 MG tablet, Take 1 tablet (25 mg total) by mouth 3 (three) times daily as needed for dizziness., Disp: 30 tablet, Rfl: 0   promethazine-dextromethorphan (PROMETHAZINE-DM) 6.25-15 MG/5ML syrup, Take 5 mLs by mouth at bedtime as needed for cough., Disp: 100 mL, Rfl: 0   rosuvastatin (CRESTOR) 40 MG tablet, Take 40 mg by mouth at bedtime., Disp: , Rfl:    venlafaxine (EFFEXOR) 100 MG tablet, Take  100 mg by mouth 2 (two) times daily., Disp: , Rfl:    Allergies  Allergen Reactions   Imitrex [Sumatriptan]     Worsens headache   Triptans     Worsens headache    Past Medical History:  Diagnosis Date   Allergy    Anemia    Anxiety    Asthma    Depression    Diabetes mellitus without complication (HCC)    Migraine    Thyroid disease      Past Surgical History:  Procedure Laterality Date   ABDOMINAL HYSTERECTOMY     LAPAROSCOPY ABDOMEN DIAGNOSTIC     TONSILLECTOMY      Family History  Problem Relation Age of Onset   Cancer Mother    Heart disease Father    Hypertension Father    Diabetes Father     Social History   Tobacco Use   Smoking status: Every Day    Packs/day: 1.00    Years: 20.00    Pack years: 20.00    Types: Cigarettes   Smokeless tobacco: Never  Vaping Use   Vaping Use: Never used  Substance Use Topics   Alcohol use: Yes    Comment: weekly   Drug use: No    ROS   Objective:   Vitals: BP 131/85   Pulse 93   Temp 98.2 F (36.8 C)   Resp 18   SpO2 97%   Physical Exam Constitutional:  General: She is not in acute distress.    Appearance: Normal appearance. She is well-developed. She is not ill-appearing, toxic-appearing or diaphoretic.  HENT:     Head: Normocephalic and atraumatic.     Nose: Nose normal.     Mouth/Throat:     Mouth: Mucous membranes are moist.     Pharynx: Oropharynx is clear.  Eyes:     General: No scleral icterus.       Right eye: No discharge.        Left eye: No discharge.     Extraocular Movements: Extraocular movements intact.     Conjunctiva/sclera: Conjunctivae normal.     Pupils: Pupils are equal, round, and reactive to light.  Cardiovascular:     Rate and Rhythm: Normal rate.  Pulmonary:     Effort: Pulmonary effort is normal.  Musculoskeletal:       Arms:     Comments: Full range of motion, strength 5/5 for upper extremities.  Skin:    General: Skin is warm and dry.  Neurological:      General: No focal deficit present.     Mental Status: She is alert and oriented to person, place, and time.     Cranial Nerves: No cranial nerve deficit.     Motor: No weakness.     Coordination: Coordination normal.     Gait: Gait normal.     Deep Tendon Reflexes: Reflexes normal.  Psychiatric:        Mood and Affect: Mood normal.        Behavior: Behavior normal.        Thought Content: Thought content normal.        Judgment: Judgment normal.    Results for orders placed or performed during the hospital encounter of 02/21/21 (from the past 24 hour(s))  POC CBG monitoring     Status: Abnormal   Collection Time: 02/21/21  6:00 PM  Result Value Ref Range   Glucose-Capillary 282 (H) 70 - 99 mg/dL   DG Cervical Spine Complete  Result Date: 02/21/2021 CLINICAL DATA:  numbness and tingling of both hands EXAM: CERVICAL SPINE - COMPLETE 4+ VIEW COMPARISON:  None. FINDINGS: There is no radiographically evident cervical spine fracture. There is moderate disc height loss at C6-C7. There is mild facet arthropathy at C7-T1. IMPRESSION: Moderate disc height loss at C6-C7. Mild facet arthropathy at C7-T1. Electronically Signed   By: Caprice Renshaw M.D.   On: 02/21/2021 18:56    Assessment and Plan :   PDMP not reviewed this encounter.  1. Cervical radiculopathy at C6   2. Numbness and tingling   3. Numbness in both hands   4. Scoliosis of thoracic spine, unspecified scoliosis type   5. Type 1 diabetes mellitus without complication (HCC)     Recommended an oral prednisone course given the radiology findings, symptoms, cervical radiculopathy.  Follow-up with endocrinologist as soon as possible to get help with managing her diabetes well on prednisone as she needs this for her diagnosis.  Follow-up with your spine specialist. Counseled patient on potential for adverse effects with medications prescribed/recommended today, ER and return-to-clinic precautions discussed, patient verbalized  understanding.    Wallis Bamberg, [place] 02/21/21 1906

## 2021-02-28 ENCOUNTER — Ambulatory Visit: Admit: 2021-02-28 | Discharge status: Absent without leave | Payer: 59

## 2021-02-28 ENCOUNTER — Emergency Department (HOSPITAL_COMMUNITY)
Admission: EM | Admit: 2021-02-28 | Discharge: 2021-03-01 | Disposition: A | Discharge status: Home / Self Care | Payer: 59 | Attending: Emergency Medicine | Admitting: Emergency Medicine

## 2021-02-28 ENCOUNTER — Emergency Department (HOSPITAL_COMMUNITY): Payer: 59

## 2021-02-28 ENCOUNTER — Encounter (HOSPITAL_COMMUNITY): Payer: Self-pay

## 2021-02-28 ENCOUNTER — Other Ambulatory Visit: Payer: Self-pay

## 2021-02-28 DIAGNOSIS — M542 Cervicalgia: Secondary | ICD-10-CM | POA: Diagnosis not present

## 2021-02-28 DIAGNOSIS — M546 Pain in thoracic spine: Secondary | ICD-10-CM | POA: Diagnosis present

## 2021-02-28 DIAGNOSIS — M5412 Radiculopathy, cervical region: Secondary | ICD-10-CM

## 2021-02-28 DIAGNOSIS — F17210 Nicotine dependence, cigarettes, uncomplicated: Secondary | ICD-10-CM | POA: Insufficient documentation

## 2021-02-28 DIAGNOSIS — Z794 Long term (current) use of insulin: Secondary | ICD-10-CM | POA: Diagnosis not present

## 2021-02-28 DIAGNOSIS — R200 Anesthesia of skin: Secondary | ICD-10-CM | POA: Insufficient documentation

## 2021-02-28 DIAGNOSIS — E1165 Type 2 diabetes mellitus with hyperglycemia: Secondary | ICD-10-CM | POA: Insufficient documentation

## 2021-02-28 DIAGNOSIS — R739 Hyperglycemia, unspecified: Secondary | ICD-10-CM

## 2021-02-28 DIAGNOSIS — J45909 Unspecified asthma, uncomplicated: Secondary | ICD-10-CM | POA: Diagnosis not present

## 2021-02-28 DIAGNOSIS — R202 Paresthesia of skin: Secondary | ICD-10-CM | POA: Diagnosis not present

## 2021-02-28 IMAGING — CT CT CERVICAL SPINE W/O CM
3 of 4 series · 13 of 33 positions shown, 16 images · non-contrast
Comparison: Plain film from [DATE]

CLINICAL DATA: Numbness and tingling in both hands for 1 week

EXAM:
CT CERVICAL SPINE WITHOUT CONTRAST
TECHNIQUE: Multidetector CT imaging of the cervical spine was performed without
intravenous contrast. Multiplanar CT image reconstructions were also
generated.

[Series 5: c_spine 2.0 st · axial · 0.31mm/px · z∈[-170,-44]mm · 5 of 95 slices shown, 7 images]
[im 16/95  soft-tissue]
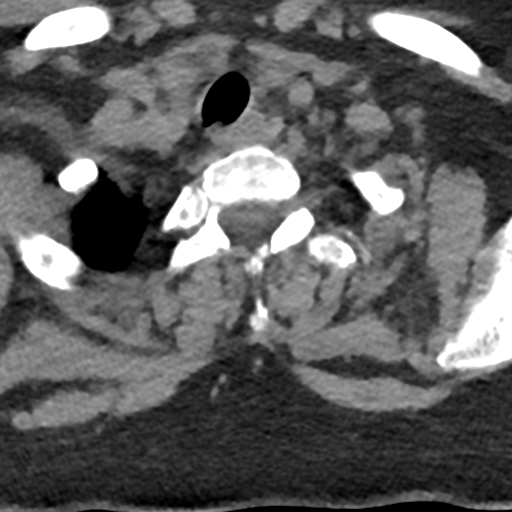
[im 16/95  bone]
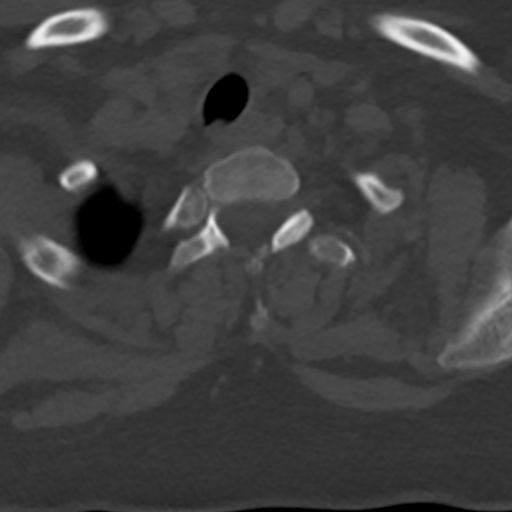
[im 32/95  bone]
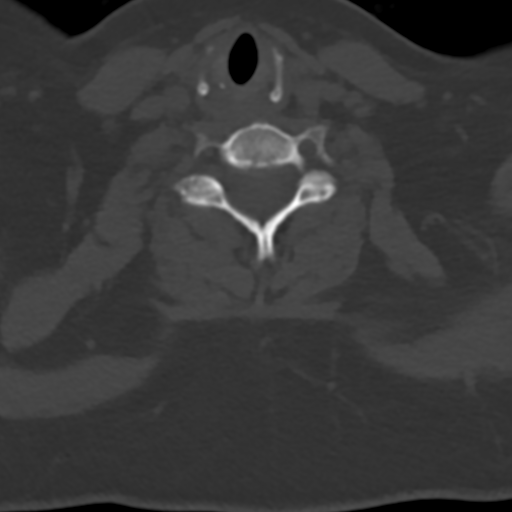
[im 48/95  bone]
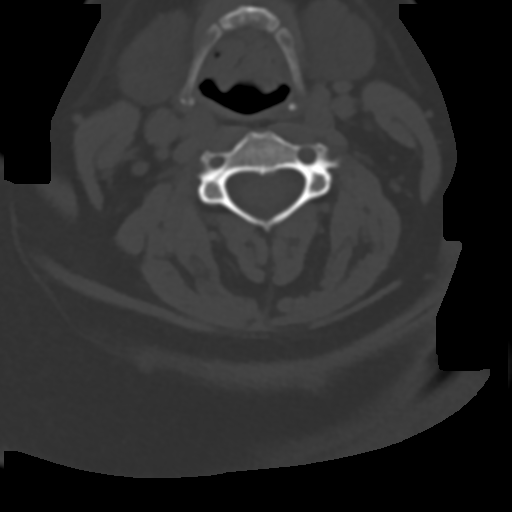
[im 63/95  bone]
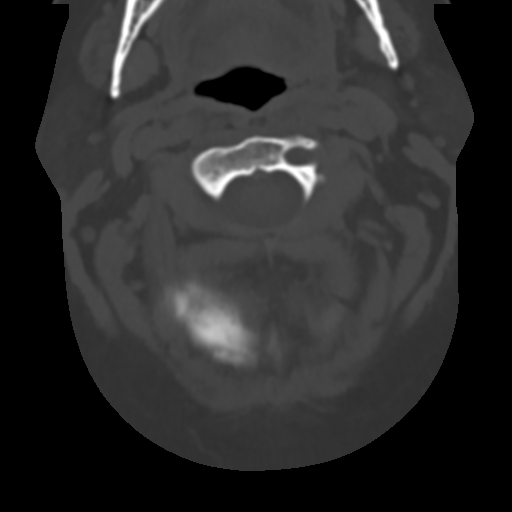
[im 79/95  soft-tissue]
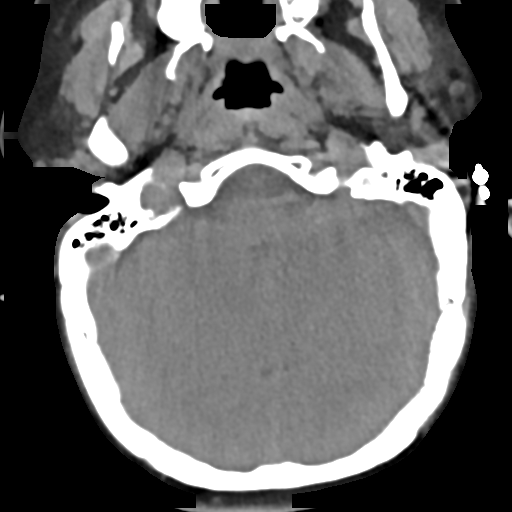
[im 79/95  bone]
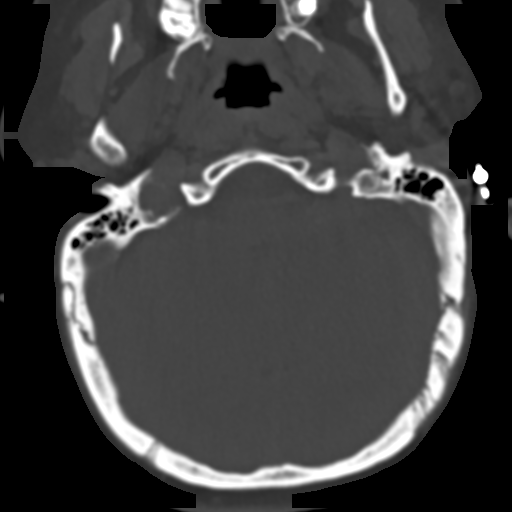

[Series 8: coronal bone · coronal · 0.28mm/px · 3 of 61 slices shown]
[im 13/61  bone]
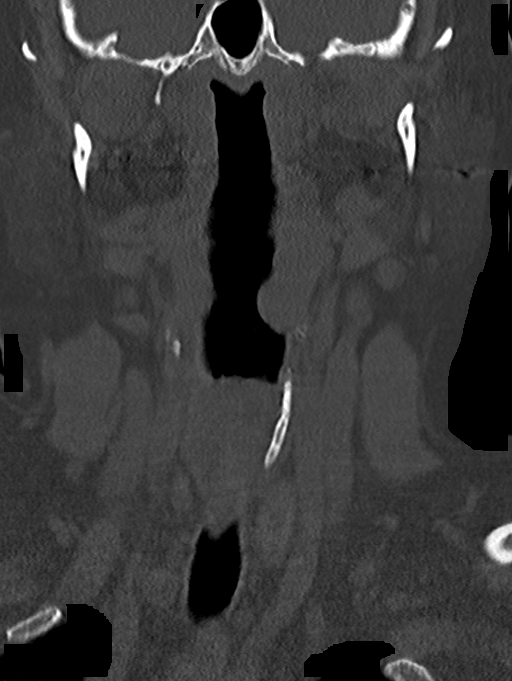
[im 25/61  bone]
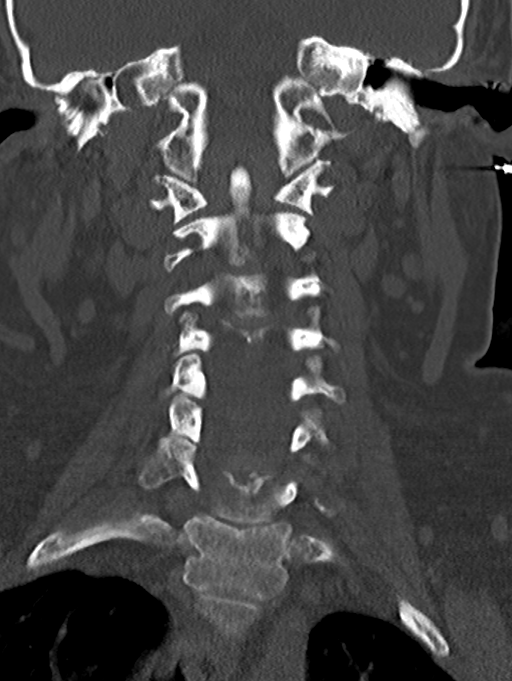
[im 37/61  bone]
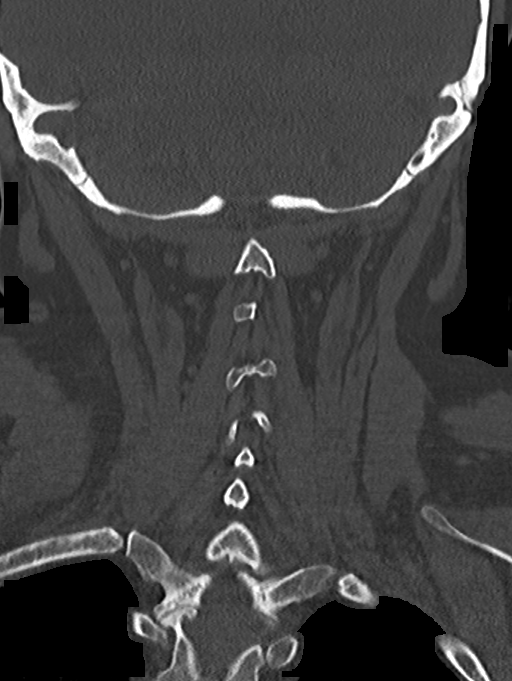

[Series 9: sagittal bone · sagittal · 0.28mm/px · 5 of 61 slices shown, 6 images]
[im 21/61  bone]
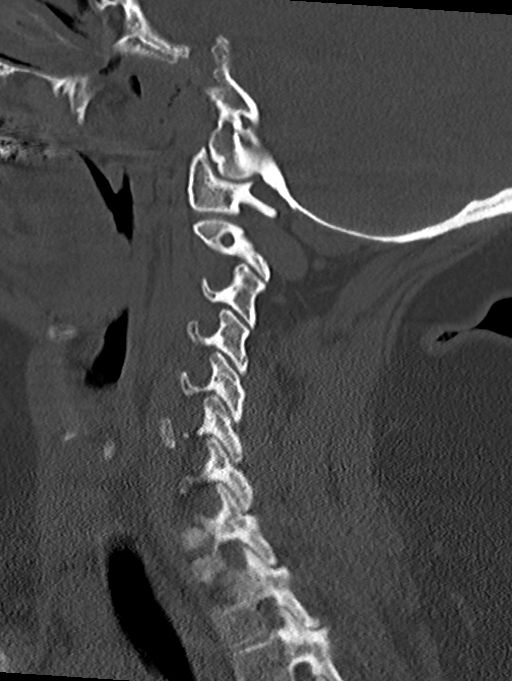
[im 26/61  bone]
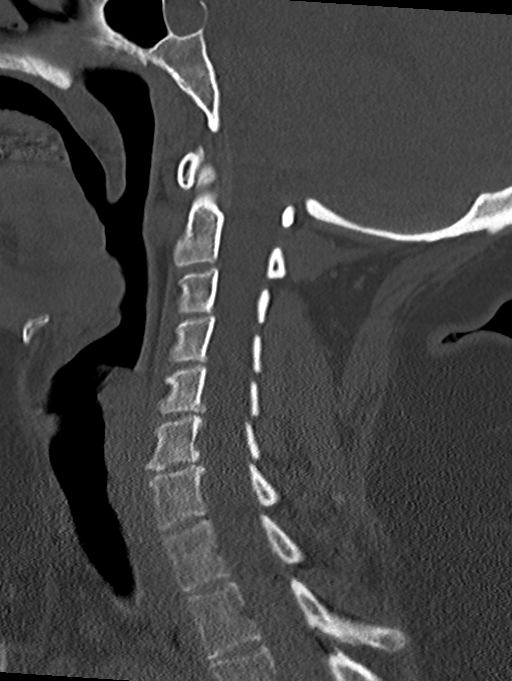
[im 31/61  soft-tissue]
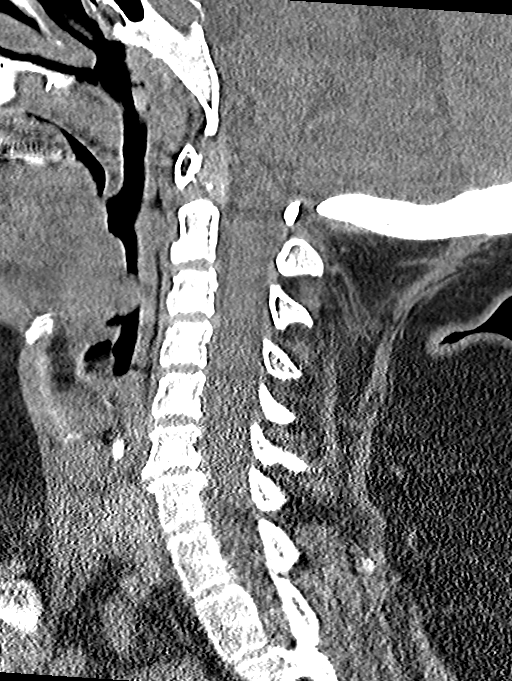
[im 31/61  bone]
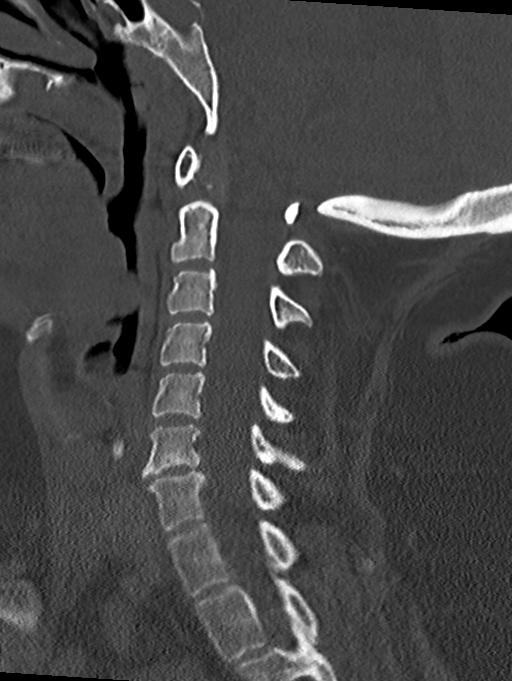
[im 36/61  bone]
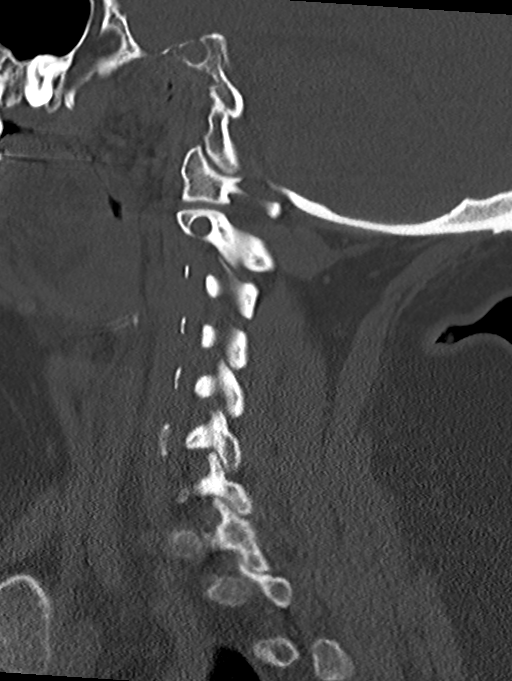
[im 41/61  bone]
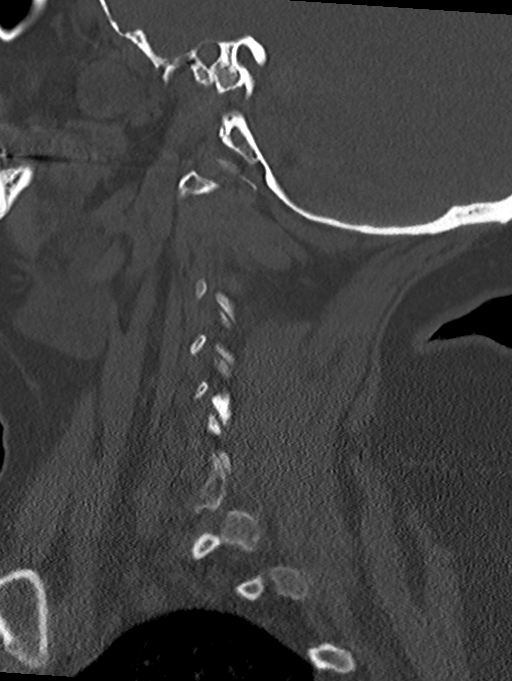

[13 of 33 positions shown; findings below may reference images not displayed]

FINDINGS: Alignment: Alignment is stable from the prior exam.

Skull base and vertebrae: Disc space narrowing is noted at C6-7 with
mild osteophytic changes similar to that seen on prior plain film
examination. No acute fracture or acute facet abnormality is noted.
No anterolisthesis is noted. The odontoid is within normal limits.

Soft tissues and spinal canal: Mild central disc bulging is noted at
C6-7 associated with the osteophytic change. No other significant
disc bulging is seen. No significant neural foraminal narrowing is
noted. Surrounding soft tissues are within normal limits.

Upper chest: Visualized lung apices are within normal limits.

Other: None
IMPRESSION: Degenerative changes at C6-7 with mild disc bulging. No significant
cord impingement is seen. No significant neural foraminal narrowing
is noted.

## 2021-02-28 MED ORDER — OXYCODONE-ACETAMINOPHEN 5-325 MG PO TABS
1.0000 | ORAL_TABLET | ORAL | Status: DC | PRN
  Administered 2021-02-28: 1 via ORAL
  Filled 2021-02-28: qty 1

## 2021-02-28 NOTE — ED Provider Notes (Addendum)
Emergency Medicine Provider Triage Evaluation Note  Denise Collins , a 40 y.o. female  was evaluated in triage.  Pt complains of continued back pain.  Seen multiple times recently and found to have disc height loss at C6-C7.  Given gabapentin and ibuprofen and has had no relief.  Cannot be seen by neuro spine until November  Review of Systems  Positive: Neck and back pain, numbness and tingling down bilateral hands Negative: New injury  Physical Exam  BP (!) 191/137 (BP Location: Left Arm)   Pulse 94   Temp 98.3 F (36.8 C)   Resp 18   SpO2 96%  Gen:   Awake, crying Resp:  Normal effort  MSK:   Moves extremities without difficulty  Other:  Full range of motion of neck and upper extremities  Medical Decision Making  Medically screening exam initiated at 6:46 PM.  Appropriate orders placed.  France R Balan was informed that the remainder of the evaluation will be completed by another provider, this initial triage assessment does not replace that evaluation, and the importance of remaining in the ED until their evaluation is complete.     Saddie Benders, PA-C 02/28/21 1847    Woodroe Chen 02/28/21 1849    Gerhard Munch, MD 02/28/21 2202

## 2021-02-28 NOTE — ED Triage Notes (Signed)
Pt c.o back pain and neck pain. Seen at [facility] last week and had Xray of C spine done. Pt given gabapentin without releif. C.o tingling in both arms and hands.

## 2021-03-01 ENCOUNTER — Emergency Department (HOSPITAL_COMMUNITY): Payer: 59

## 2021-03-01 LAB — CBG MONITORING, ED
Glucose-Capillary: 572 mg/dL (ref 70–99)
Glucose-Capillary: 252 mg/dL — ABNORMAL HIGH (ref 70–99)

## 2021-03-01 LAB — BASIC METABOLIC PANEL
Anion gap: 9 (ref 5–15)
BUN: 18 mg/dL (ref 6–20)
CO2: 28 mmol/L (ref 22–32)
Calcium: 9.3 mg/dL (ref 8.9–10.3)
Chloride: 93 mmol/L — ABNORMAL LOW (ref 98–111)
Creatinine, Ser: 0.87 mg/dL (ref 0.44–1.00)
GFR, Estimated: >60 mL/min (ref >60)
Glucose, Bld: 559 mg/dL (ref 70–99)
Potassium: 4.7 mmol/L (ref 3.5–5.1)
Sodium: 130 mmol/L — ABNORMAL LOW (ref 135–145)

## 2021-03-01 LAB — CBC WITH DIFFERENTIAL/PLATELET
Abs Immature Granulocytes: 0.12 10*3/uL — ABNORMAL HIGH (ref 0.00–0.07)
Basophils Absolute: 0.1 10*3/uL (ref 0.0–0.1)
Basophils Relative: 0 %
Eosinophils Absolute: 0.5 10*3/uL (ref 0.0–0.5)
Eosinophils Relative: 4 %
HCT: 47 % — ABNORMAL HIGH (ref 36.0–46.0)
Hemoglobin: 15.1 g/dL — ABNORMAL HIGH (ref 12.0–15.0)
Immature Granulocytes: 1 %
Lymphocytes Relative: 30 %
Lymphs Abs: 4 10*3/uL (ref 0.7–4.0)
MCH: 30.4 pg (ref 26.0–34.0)
MCHC: 32.1 g/dL (ref 30.0–36.0)
MCV: 94.6 fL (ref 80.0–100.0)
Monocytes Absolute: 1.1 10*3/uL — ABNORMAL HIGH (ref 0.1–1.0)
Monocytes Relative: 8 %
Neutro Abs: 7.5 10*3/uL (ref 1.7–7.7)
Neutrophils Relative %: 57 %
Platelets: 383 10*3/uL (ref 150–400)
RBC: 4.97 MIL/uL (ref 3.87–5.11)
RDW: 13.1 % (ref 11.5–15.5)
WBC: 13.2 10*3/uL — ABNORMAL HIGH (ref 4.0–10.5)
nRBC: 0 % (ref 0.0–0.2)

## 2021-03-01 IMAGING — MR MR CERVICAL SPINE W/O CM
6 of 11 series · 19 of 48 positions shown · non-contrast
Comparison: CT cervical spine [DATE]

CLINICAL DATA: Progressive neck and back pain.

EXAM:
MRI CERVICAL SPINE WITHOUT CONTRAST
TECHNIQUE: Multiplanar, multisequence MR imaging of the cervical spine was
performed. No intravenous contrast was administered.

[Series 3: T2 · sagittal · 3.0mm · 0.43mm/px · 2 of 17 slices shown (1 of 4)]
[im 1/17]
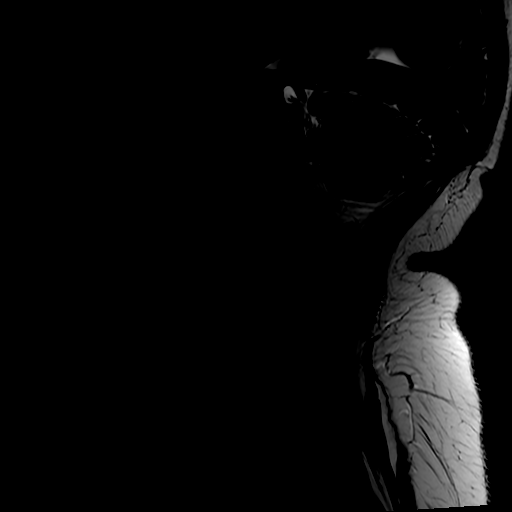
[im 17/17]
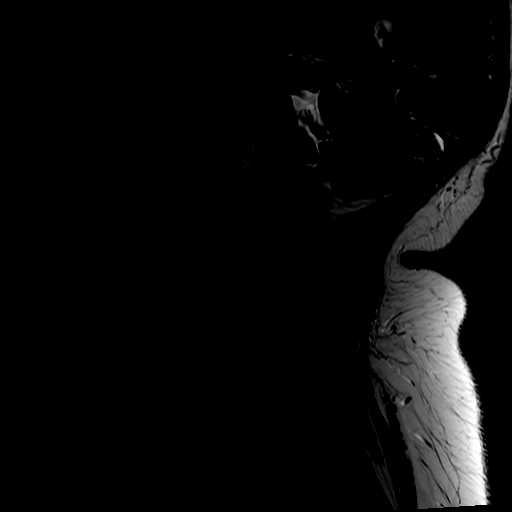

[Series 7: T2 · axial · 3.0mm · 0.35mm/px · z∈[+22,+107]mm · 4 of 26 slices shown (2 of 4)]
[im 1/26]
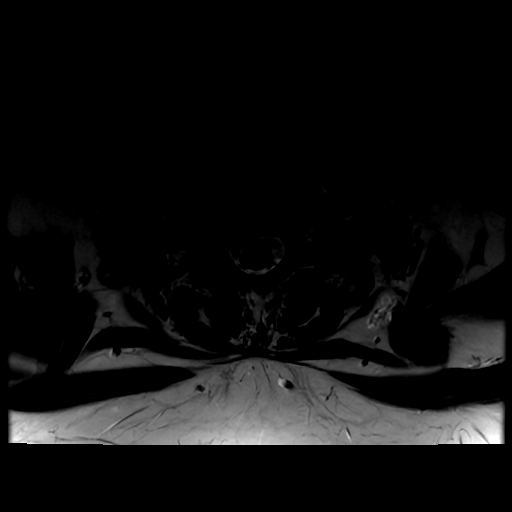
[im 9/26]
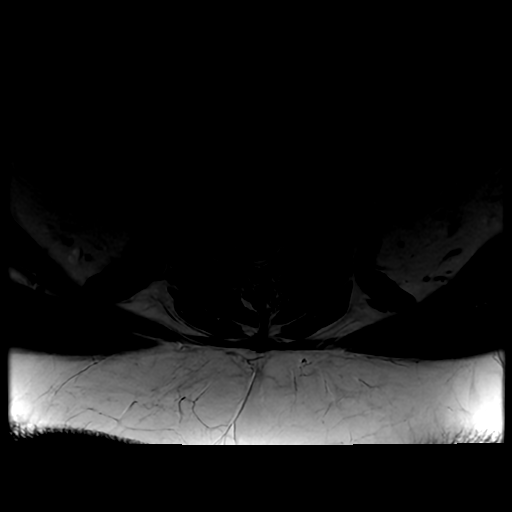
[im 17/26]
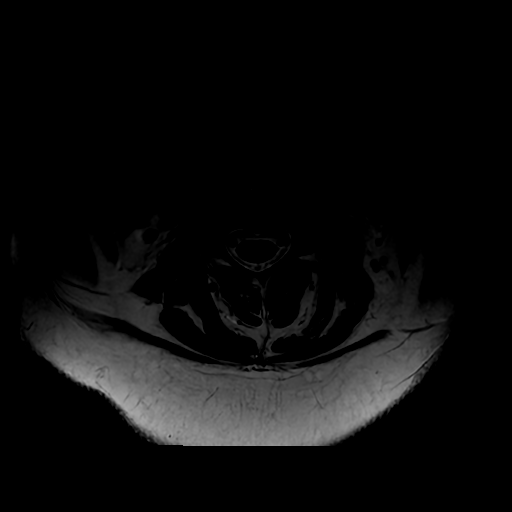
[im 26/26]
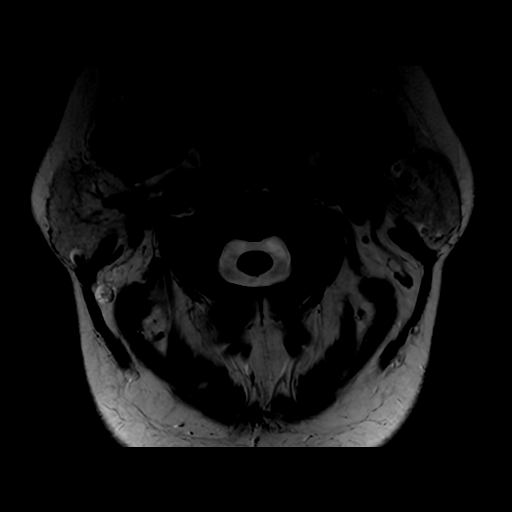

[Series 9: T1 · sagittal · 3.0mm · 0.90mm/px · 2 of 12 slices shown (1 of 2)]
[im 1/12]
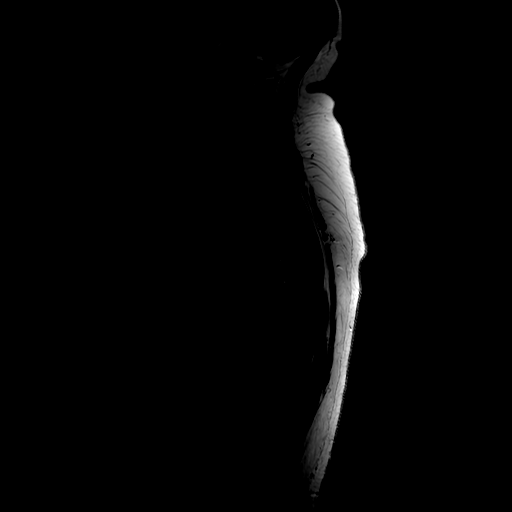
[im 12/12]
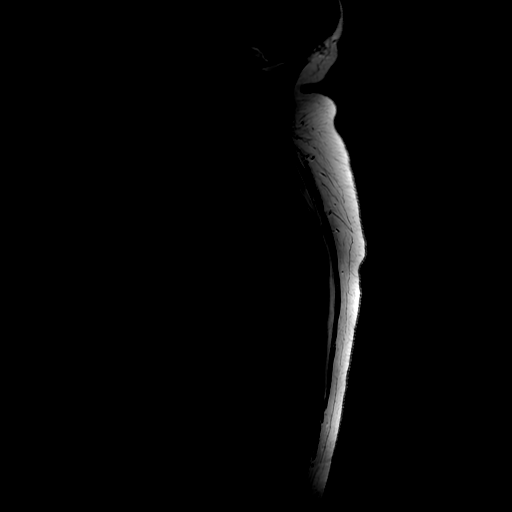

[Series 11: T2 · sagittal · 3.0mm · 0.66mm/px · 3 of 16 slices shown (3 of 4)]
[im 1/16]
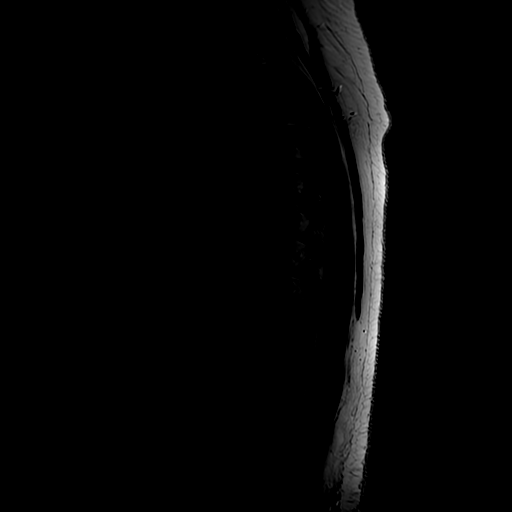
[im 8/16]
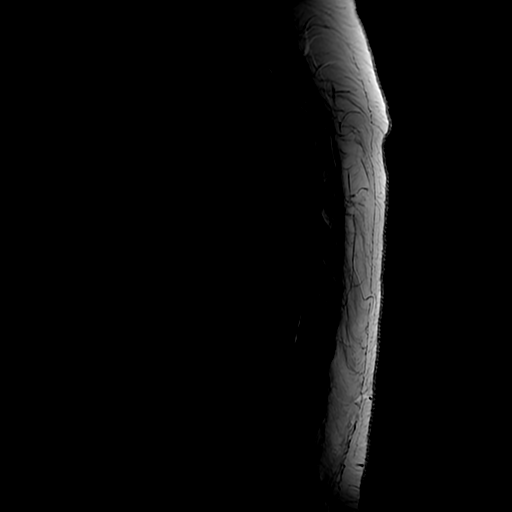
[im 16/16]
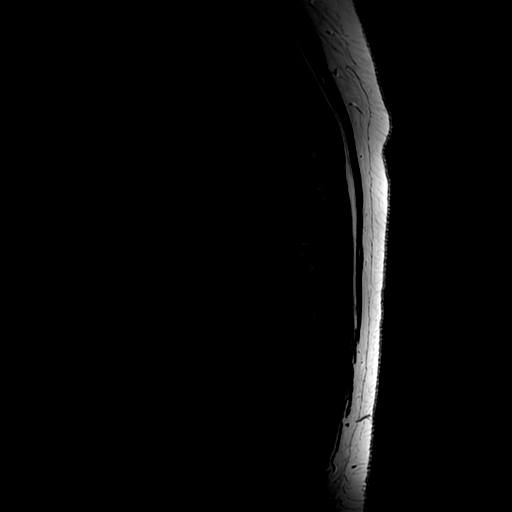

[Series 13: T1 · sagittal · 3.0mm · 0.66mm/px · 1 of 16 slices shown (2 of 2)]
[im 1/16]
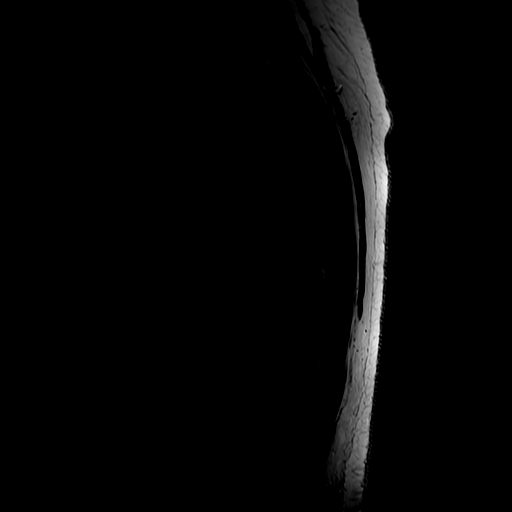

[Series 14: T2 · axial · 4.0mm · 0.39mm/px · z∈[-228,+22]mm · 7 of 40 slices shown (4 of 4)]
[im 1/40]
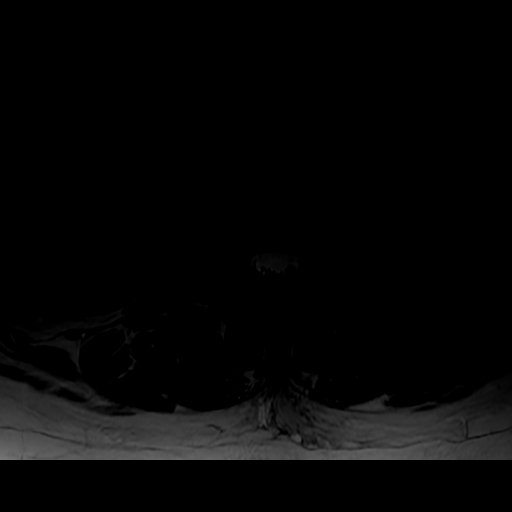
[im 7/40]
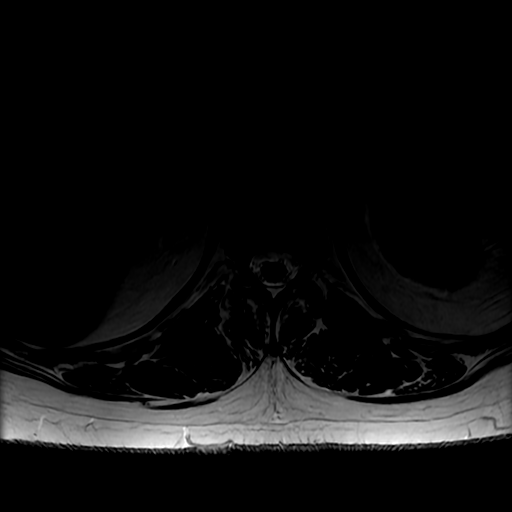
[im 14/40]
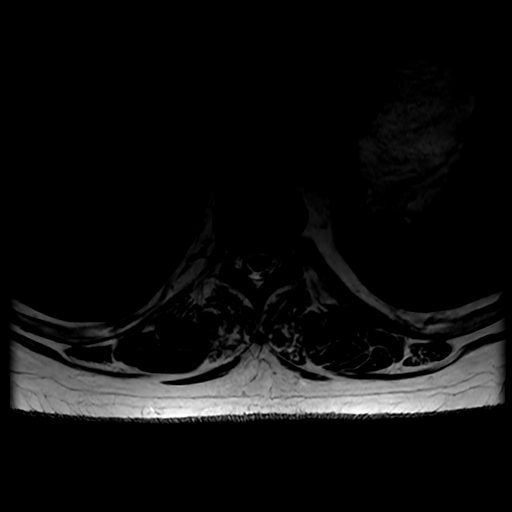
[im 20/40]
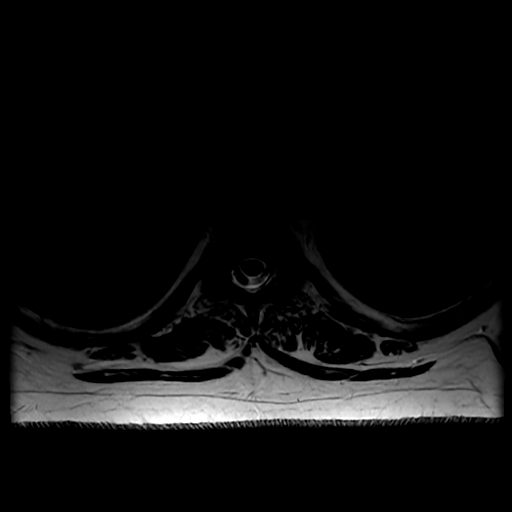
[im 27/40]
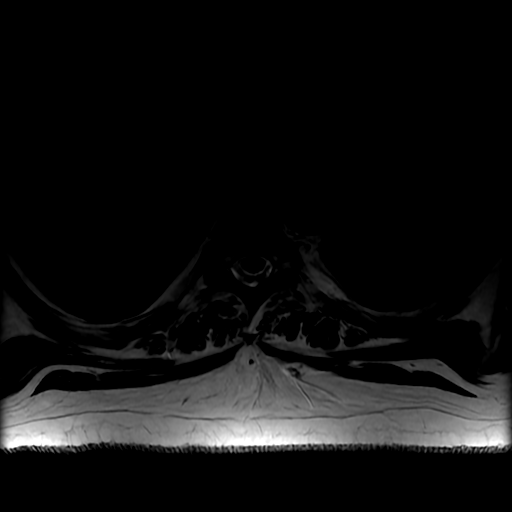
[im 33/40]
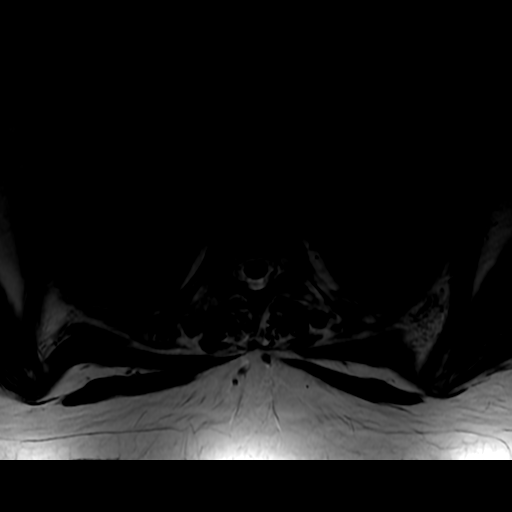
[im 40/40]
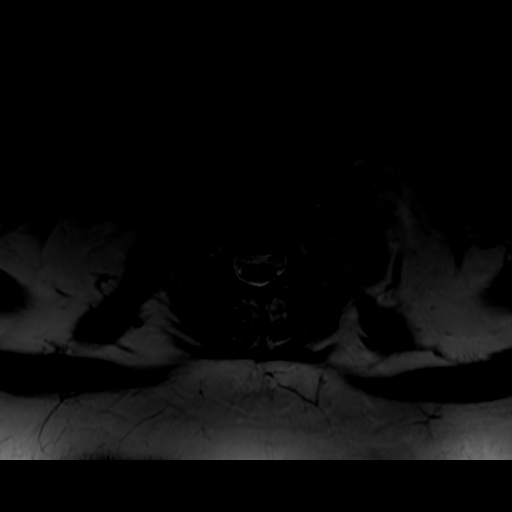

[19 of 48 positions shown; findings below may reference images not displayed]

FINDINGS: Alignment: Normal

Vertebrae: Normal marrow signal.  No bone lesions or fractures.

Cord: Normal cord signal intensity.  No cord lesions or syrinx.

Posterior Fossa, vertebral arteries, paraspinal tissues: No
significant findings.

Disc levels:

C2-3: No significant findings.

C3-4: No significant findings.

C4-5: No significant findings.

C5-6: No significant findings.

C6-7: Degenerative disc disease with a diffuse bulging annulus and a
focal central disc protrusion. There is mild mass effect on the
central aspect of the thecal sac and mild focal narrowing of the
ventral CSF space. No foraminal stenosis.

C7-T1: No significant findings.
IMPRESSION: 1. Degenerative disc disease at C6-7 with a diffuse bulging annulus
and a focal central disc protrusion. Mild mass effect on the central
aspect of the thecal sac and mild focal narrowing of the ventral CSF
space.
2. Otherwise, unremarkable cervical spine MRI examination.

## 2021-03-01 MED ORDER — OXYCODONE-ACETAMINOPHEN 5-325 MG PO TABS
1.0000 | ORAL_TABLET | Freq: Once | ORAL | Status: AC
  Administered 2021-03-01: 1 via ORAL
  Filled 2021-03-01: qty 1

## 2021-03-01 MED ORDER — OXYCODONE-ACETAMINOPHEN 5-325 MG PO TABS: 1.0000 | ORAL_TABLET | Freq: Four times a day (QID) | ORAL | 0 refills | Status: DC | PRN

## 2021-03-01 MED ORDER — SODIUM CHLORIDE 0.9 % IV BOLUS
1000.0000 mL | Freq: Once | INTRAVENOUS | Status: AC
  Administered 2021-03-01: 1000 mL via INTRAVENOUS

## 2021-03-01 MED ORDER — INSULIN ASPART 100 UNIT/ML IJ SOLN
40.0000 [IU] | Freq: Once | INTRAMUSCULAR | Status: AC
  Administered 2021-03-01: 40 [IU] via SUBCUTANEOUS

## 2021-03-01 NOTE — ED Notes (Signed)
Pt taken to mri

## 2021-03-01 NOTE — ED Provider Notes (Signed)
MRI of the T-spine is unremarkable.  MRI of the C-spine concerning for disc protrusion at C6/7 with mild mass-effect at the thecal sac at this region.  Imaging results and case discussed with on-call neurosurgery, recommending outpatient follow-up in their office.  Patient discharged home stable condition advised him to call neurosurgery to schedule appointment.    Cheryll Cockayne, MD 03/01/21 [phone]
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
## 2021-03-01 NOTE — Discharge Instructions (Addendum)
Call your primary care doctor or specialist as discussed in the next 2-3 days.   Return immediately back to the ER if:  Your symptoms worsen within the next 12-24 hours. You develop new symptoms such as new fevers, persistent vomiting, new pain, shortness of breath, or new weakness or numbness, or if you have any other concerns.  

## 2021-03-01 NOTE — ED Notes (Signed)
MD Rancour notified of CBG

## 2021-08-02 DIAGNOSIS — G5603 Carpal tunnel syndrome, bilateral upper limbs: Secondary | ICD-10-CM | POA: Insufficient documentation

## 2021-09-10 ENCOUNTER — Emergency Department (HOSPITAL_COMMUNITY): Payer: 59

## 2021-09-10 ENCOUNTER — Encounter (HOSPITAL_COMMUNITY): Payer: Self-pay | Admitting: Emergency Medicine

## 2021-09-10 ENCOUNTER — Other Ambulatory Visit: Payer: Self-pay

## 2021-09-10 ENCOUNTER — Emergency Department (HOSPITAL_COMMUNITY)
Admission: EM | Admit: 2021-09-10 | Discharge: 2021-09-10 | Disposition: A | Discharge status: Home / Self Care | Payer: 59 | Attending: Emergency Medicine | Admitting: Emergency Medicine

## 2021-09-10 DIAGNOSIS — G510 Bell's palsy: Secondary | ICD-10-CM | POA: Insufficient documentation

## 2021-09-10 DIAGNOSIS — R29810 Facial weakness: Secondary | ICD-10-CM | POA: Diagnosis present

## 2021-09-10 DIAGNOSIS — E119 Type 2 diabetes mellitus without complications: Secondary | ICD-10-CM | POA: Diagnosis not present

## 2021-09-10 DIAGNOSIS — Z794 Long term (current) use of insulin: Secondary | ICD-10-CM | POA: Insufficient documentation

## 2021-09-10 DIAGNOSIS — I639 Cerebral infarction, unspecified: Secondary | ICD-10-CM | POA: Diagnosis not present

## 2021-09-10 LAB — COMPREHENSIVE METABOLIC PANEL
ALT: 18 U/L (ref 0–44)
AST: 13 U/L — ABNORMAL LOW (ref 15–41)
Albumin: 3.8 g/dL (ref 3.5–5.0)
Alkaline Phosphatase: 75 U/L (ref 38–126)
Anion gap: 8 (ref 5–15)
BUN: 17 mg/dL (ref 6–20)
CO2: 24 mmol/L (ref 22–32)
Calcium: 9.2 mg/dL (ref 8.9–10.3)
Chloride: 107 mmol/L (ref 98–111)
Creatinine, Ser: 0.72 mg/dL (ref 0.44–1.00)
GFR, Estimated: >60 mL/min (ref >60)
Glucose, Bld: 195 mg/dL — ABNORMAL HIGH (ref 70–99)
Potassium: 3.7 mmol/L (ref 3.5–5.1)
Sodium: 139 mmol/L (ref 135–145)
Total Bilirubin: 0.8 mg/dL (ref 0.3–1.2)
Total Protein: 6.9 g/dL (ref 6.5–8.1)

## 2021-09-10 LAB — CBC
HCT: 42.7 % (ref 36.0–46.0)
Hemoglobin: 14.1 g/dL (ref 12.0–15.0)
MCH: 30.4 pg (ref 26.0–34.0)
MCHC: 33 g/dL (ref 30.0–36.0)
MCV: 92 fL (ref 80.0–100.0)
Platelets: 407 10*3/uL — ABNORMAL HIGH (ref 150–400)
RBC: 4.64 MIL/uL (ref 3.87–5.11)
RDW: 13.9 % (ref 11.5–15.5)
WBC: 13.7 10*3/uL — ABNORMAL HIGH (ref 4.0–10.5)
nRBC: 0 % (ref 0.0–0.2)

## 2021-09-10 LAB — DIFFERENTIAL
Abs Immature Granulocytes: 0.06 10*3/uL (ref 0.00–0.07)
Basophils Absolute: 0.1 10*3/uL (ref 0.0–0.1)
Basophils Relative: 1 %
Eosinophils Absolute: 0.5 10*3/uL (ref 0.0–0.5)
Eosinophils Relative: 3 %
Immature Granulocytes: 0 %
Lymphocytes Relative: 24 %
Lymphs Abs: 3.3 10*3/uL (ref 0.7–4.0)
Monocytes Absolute: 1 10*3/uL (ref 0.1–1.0)
Monocytes Relative: 8 %
Neutro Abs: 8.7 10*3/uL — ABNORMAL HIGH (ref 1.7–7.7)
Neutrophils Relative %: 64 %

## 2021-09-10 LAB — APTT: aPTT: 32 seconds (ref 24–36)

## 2021-09-10 LAB — PROTIME-INR
INR: 1 (ref 0.8–1.2)
Prothrombin Time: 13 seconds (ref 11.4–15.2)

## 2021-09-10 LAB — CBG MONITORING, ED: Glucose-Capillary: 190 mg/dL — ABNORMAL HIGH (ref 70–99)

## 2021-09-10 IMAGING — MR MR HEAD WO/W CM
13 series · 48 of 48 positions shown · IV contrast (gadavist)
Comparison: [DATE], correlation is also made with CT head
[DATE]

CLINICAL DATA: Left facial droop, stroke suspected

EXAM:
MRI HEAD WITHOUT AND WITH CONTRAST
TECHNIQUE: Multiplanar, multiecho pulse sequences of the brain and surrounding
structures were obtained without and with intravenous contrast.
CONTRAST:  10mL GADAVIST GADOBUTROL 1 MMOL/ML IV SOLN

[Series 5: DWI · axial · 3.0mm · 1.36mm/px · z∈[-105,+53]mm · 7 of 108 slices shown (1 of 2)]
[im 1/108]
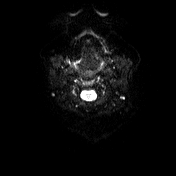
[im 18/108]
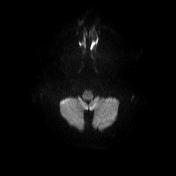
[im 36/108]
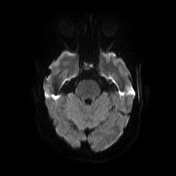
[im 54/108]
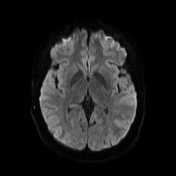
[im 72/108]
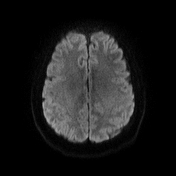
[im 90/108]
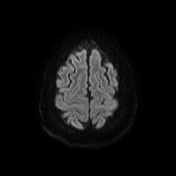
[im 108/108]
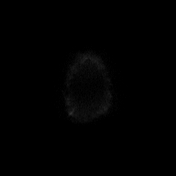

[Series 6: DWI · axial · 3.0mm · 1.36mm/px · z∈[-105,+53]mm · 4 of 54 slices shown (2 of 2)]
[im 1/54]
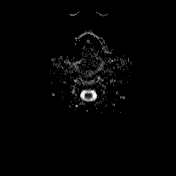
[im 18/54]
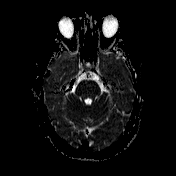
[im 36/54]
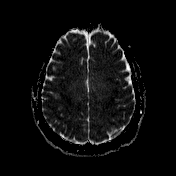
[im 54/54]
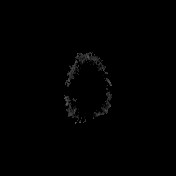

[Series 7: T1 · sagittal · 5.0mm · 0.75mm/px · 1 of 25 slices shown (1 of 2)]
[im 1/25]
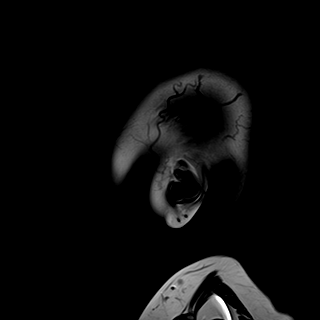

[Series 8: T2 · axial · 5.0mm · 0.62mm/px · z∈[-103,+59]mm · 2 of 26 slices shown]
[im 1/26]
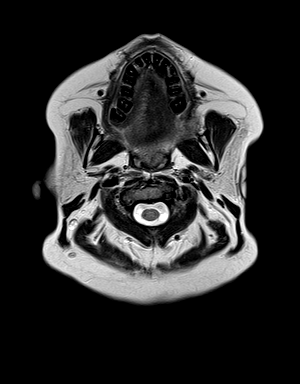
[im 26/26]
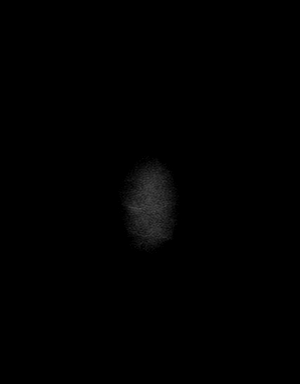

[Series 9: swi_images · axial · 3.0mm · 0.75mm/px · z∈[-104,+60]mm · 3 of 56 slices shown]
[im 1/56]
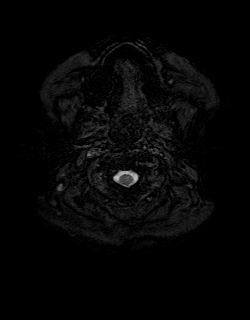
[im 28/56]
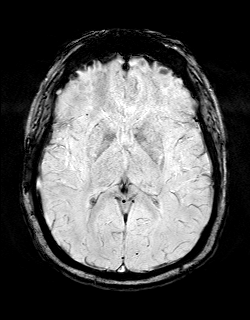
[im 56/56]
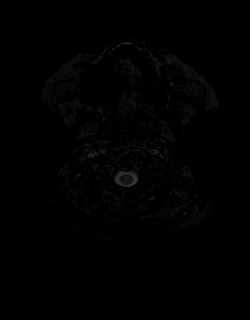

[Series 11: FLAIR · axial · 3.0mm · 0.75mm/px · z∈[-101,+57]mm · 3 of 54 slices shown]
[im 1/54]
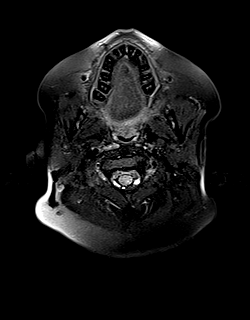
[im 27/54]
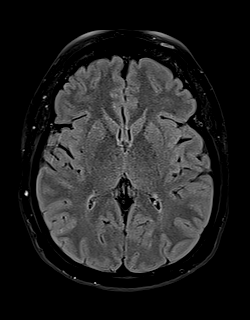
[im 54/54]
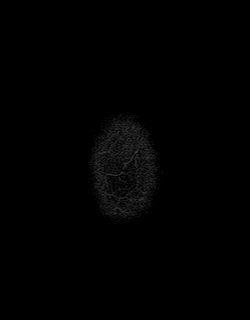

[Series 12: T1 · axial · 1.0mm · 0.94mm/px · z∈[-98,+60]mm · 9 of 160 slices shown (2 of 2)]
[im 1/160]
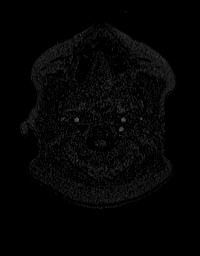
[im 20/160]
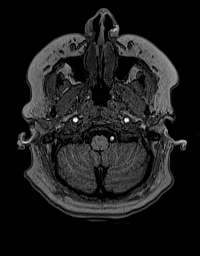
[im 40/160]
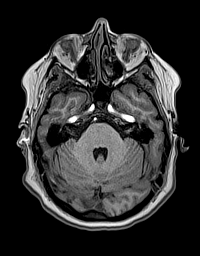
[im 60/160]
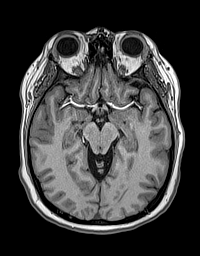
[im 80/160]
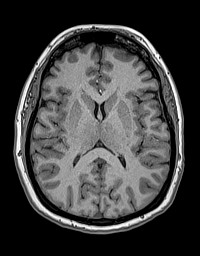
[im 100/160]
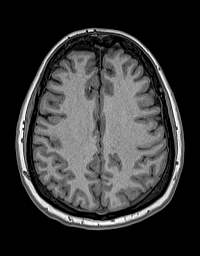
[im 120/160]
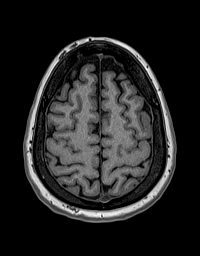
[im 140/160]
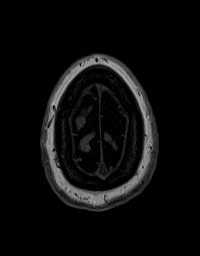
[im 160/160]
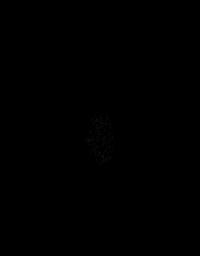

[Series 13: cor dwi_tracew · coronal · 5.0mm · 1.53mm/px · 3 of 56 slices shown]
[im 1/56]
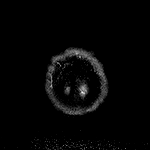
[im 28/56]
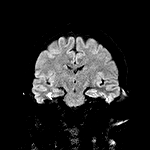
[im 56/56]
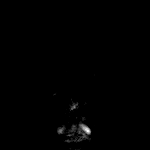

[Series 14: cor dwi_adc · coronal · 5.0mm · 1.53mm/px · 2 of 28 slices shown]
[im 1/28]
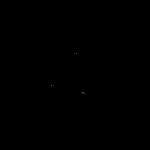
[im 28/28]
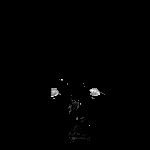

[Series 15: T2 post-contrast · coronal · 5.0mm · 0.57mm/px · 2 of 29 slices shown]
[im 1/29]
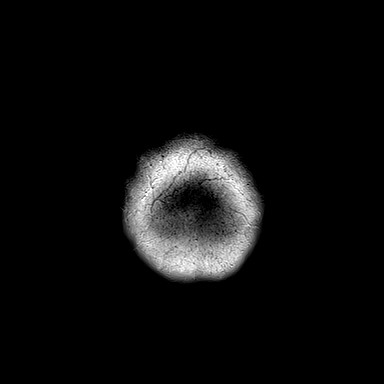
[im 29/29]
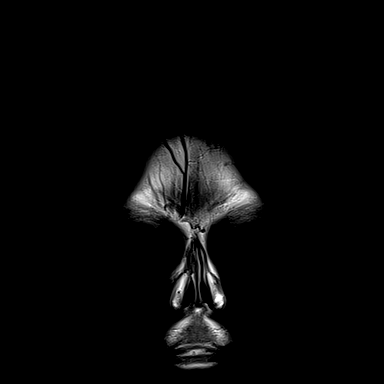

[Series 16: T1 post-contrast · axial · 1.0mm · 0.94mm/px · z∈[-98,+60]mm · 9 of 160 slices shown (1 of 3)]
[im 1/160]
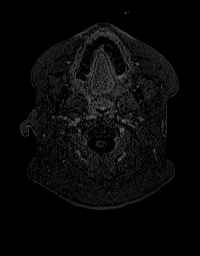
[im 20/160]
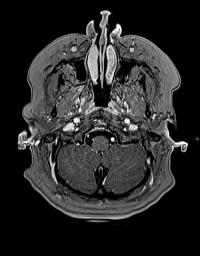
[im 40/160]
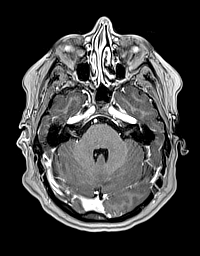
[im 60/160]
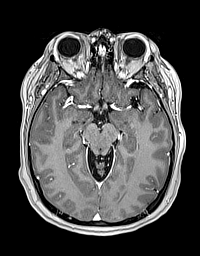
[im 80/160]
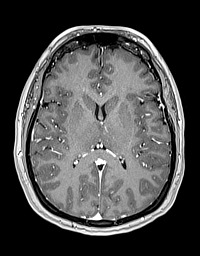
[im 100/160]
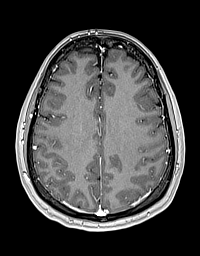
[im 120/160]
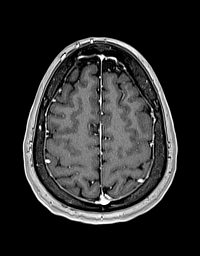
[im 140/160]
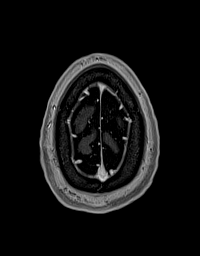
[im 160/160]
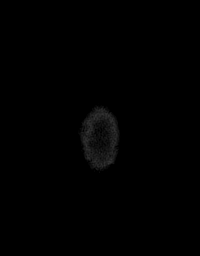

[Series 17: T1 post-contrast · coronal · 5.0mm · 0.43mm/px · 2 of 29 slices shown (2 of 3)]
[im 1/29]
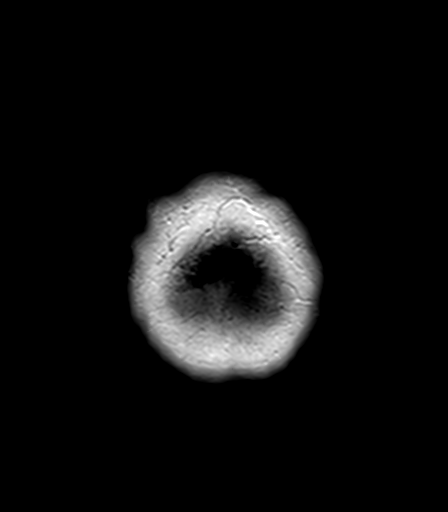
[im 29/29]
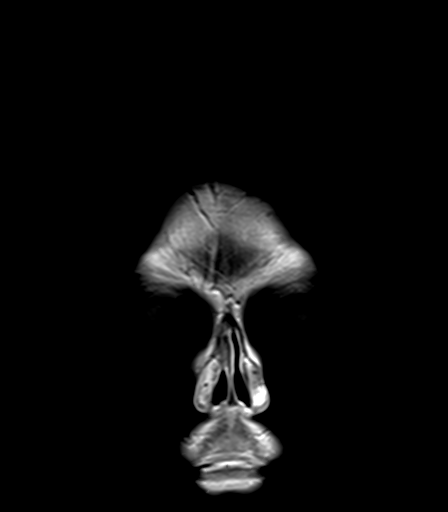

[Series 18: T1 post-contrast · sagittal · 5.0mm · 0.75mm/px · 1 of 25 slices shown (3 of 3)]
[im 1/25]
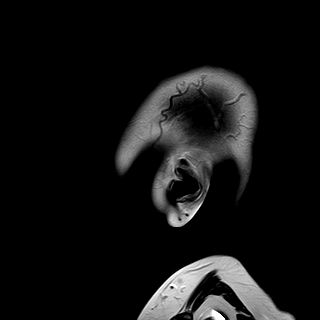

[48 of 48 positions shown; findings below may reference images not displayed]

FINDINGS: Brain: No restricted diffusion to suggest acute or subacute infarct.
No acute hemorrhage, mass, mass effect, or midline shift. No
hemosiderin deposition to suggest remote hemorrhage. No abnormal
parenchymal or meningeal enhancement. No hydrocephalus or
extra-axial collection. Scattered T2 hyperintense signal in the
bilateral frontal lobe white matter.

Vascular: Normal flow voids.

Skull and upper cervical spine: Normal marrow signal.

Sinuses/Orbits: No acute finding.

Other: None.
IMPRESSION: 1.  No acute intracranial process.
2. Scattered T2 hyperintense signal in the bilateral frontal lobe
white matter, which are nonspecific and can be seen in the setting
of early small vessel ischemic disease or chronic headaches. These
are not in a pattern concerning for demyelination.

## 2021-09-10 IMAGING — CT CT HEAD W/O CM
3 series · 15 of 47 positions shown, 18 images · non-contrast
Comparison: [DATE] brain MR

CLINICAL DATA: 40-year-old female with LEFT facial droop and
numbness.



[Series 3: head wo · axial · 0.47mm/px · z∈[+1374,+1510]mm · 9 of 33 slices shown, 12 images]
[im 3/33  brain]
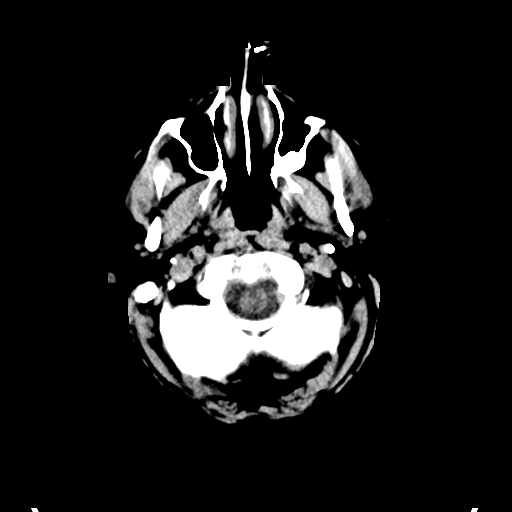
[im 3/33  bone]
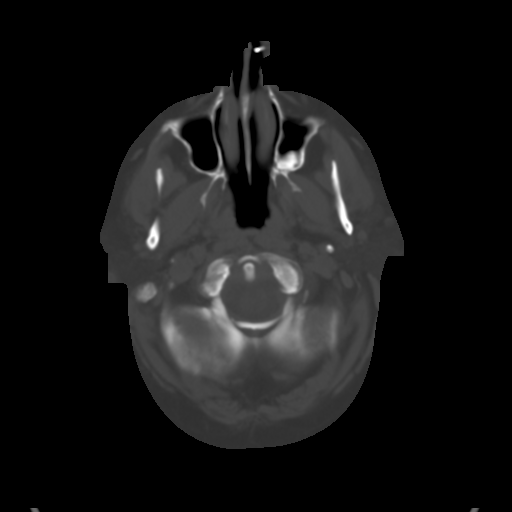
[im 6/33  brain]
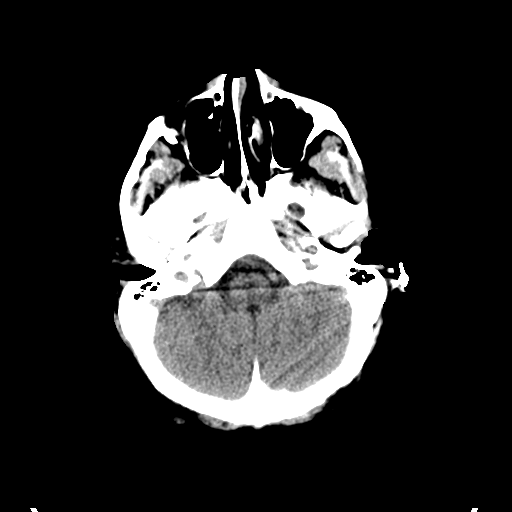
[im 9/33  brain]
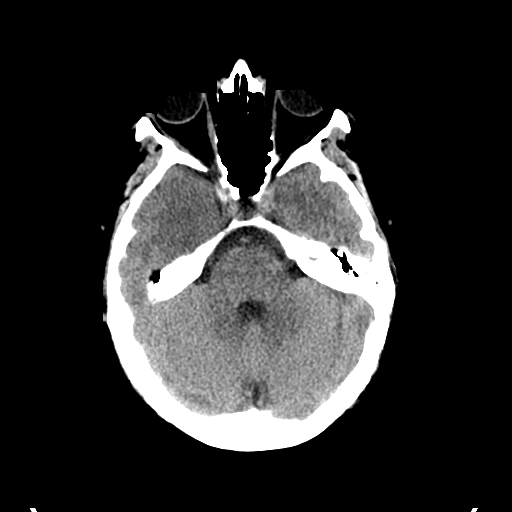
[im 13/33  brain]
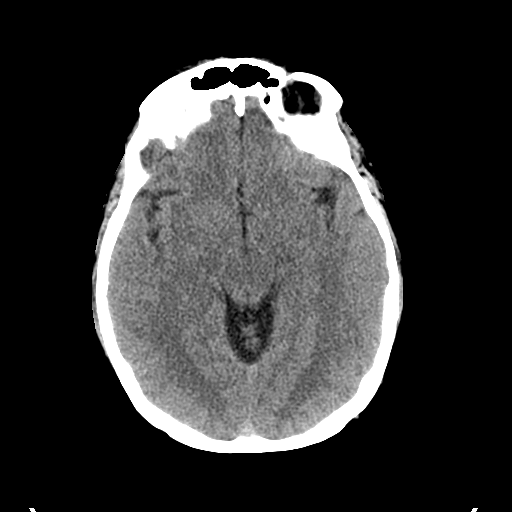
[im 17/33  brain]
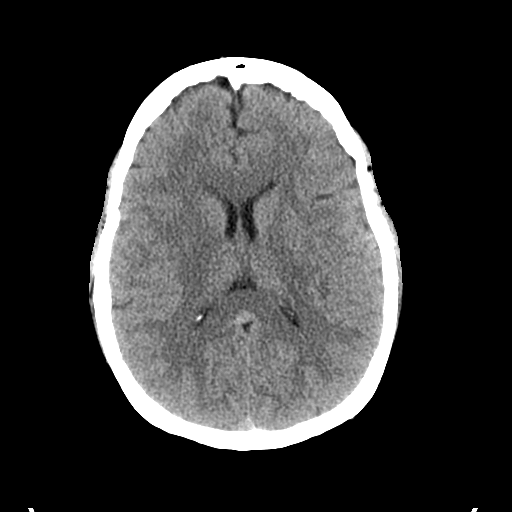
[im 17/33  bone]
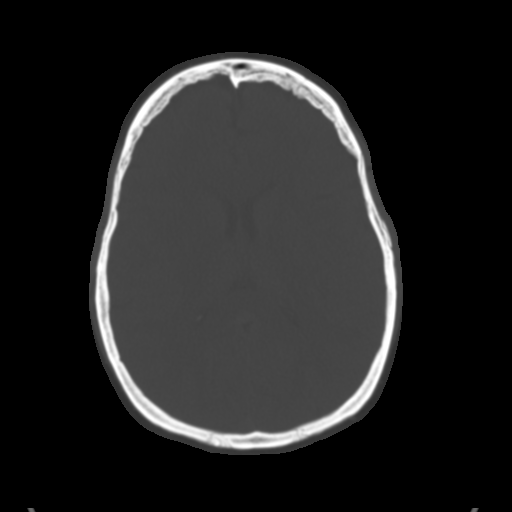
[im 20/33  brain]
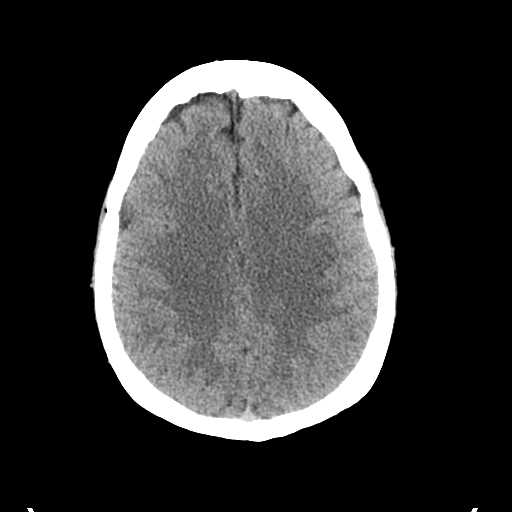
[im 24/33  brain]
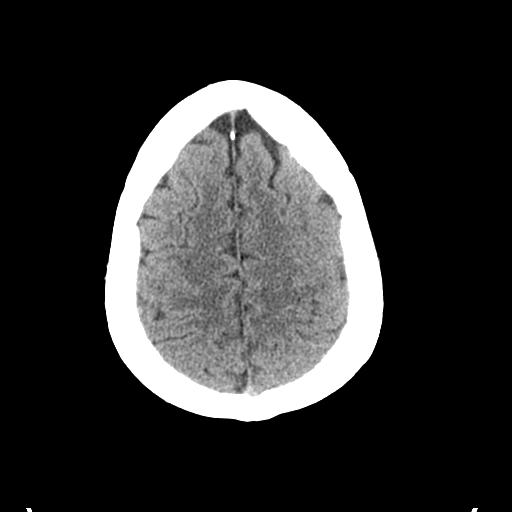
[im 27/33  brain]
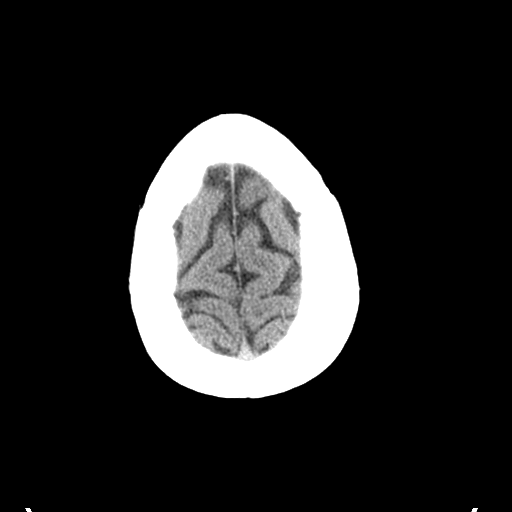
[im 30/33  brain]
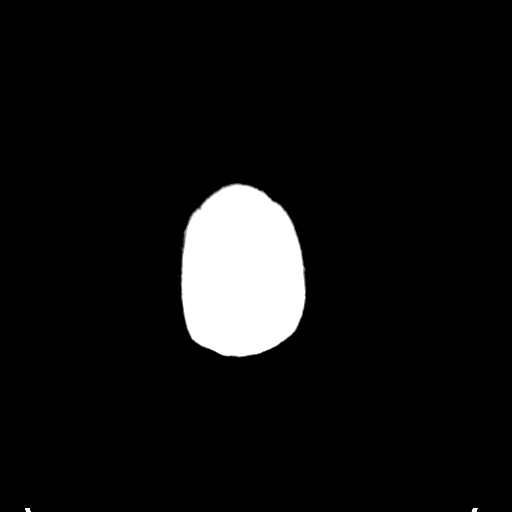
[im 30/33  bone]
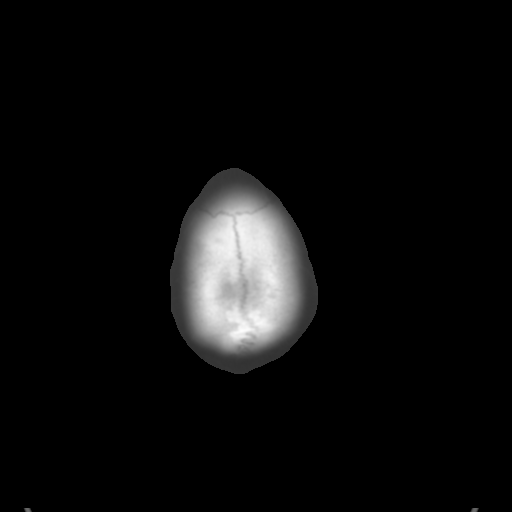

[Series 4: coronal soft tissue · coronal · 0.33mm/px · 3 of 74 slices shown]
[im 25/74  brain]
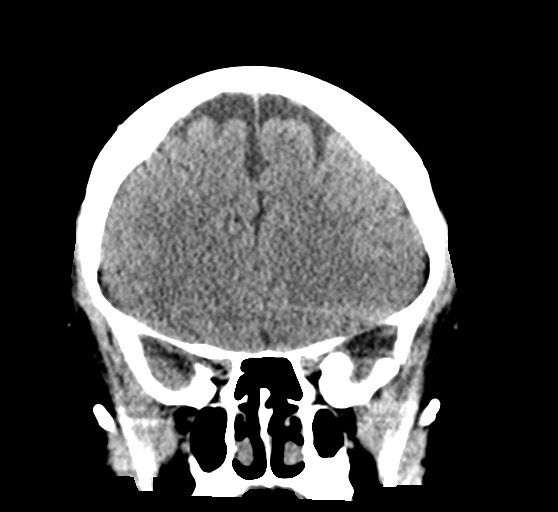
[im 33/74  brain]
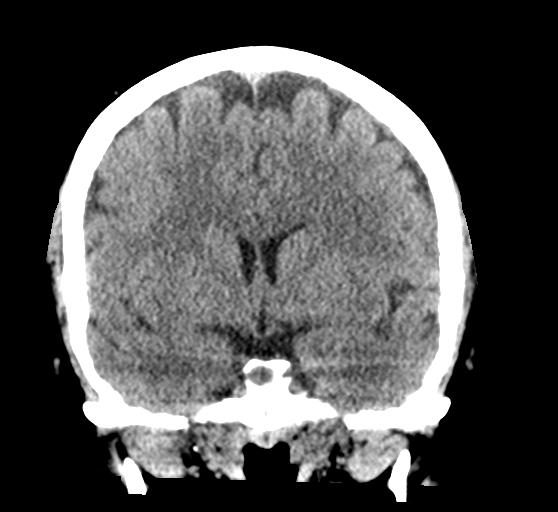
[im 41/74  brain]
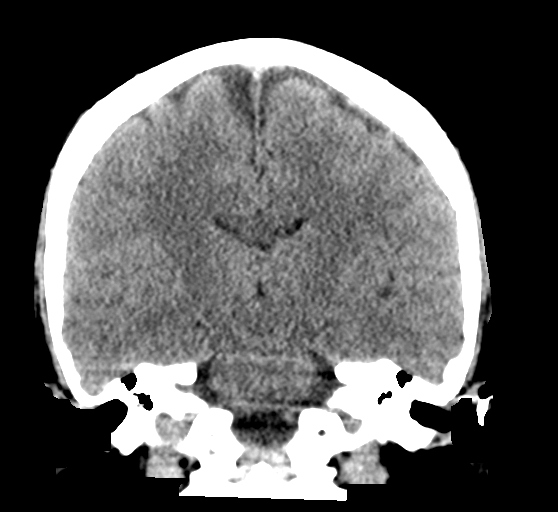

[Series 5: sagittal soft tissue · sagittal · 0.33mm/px · 3 of 61 slices shown]
[im 21/61  brain]
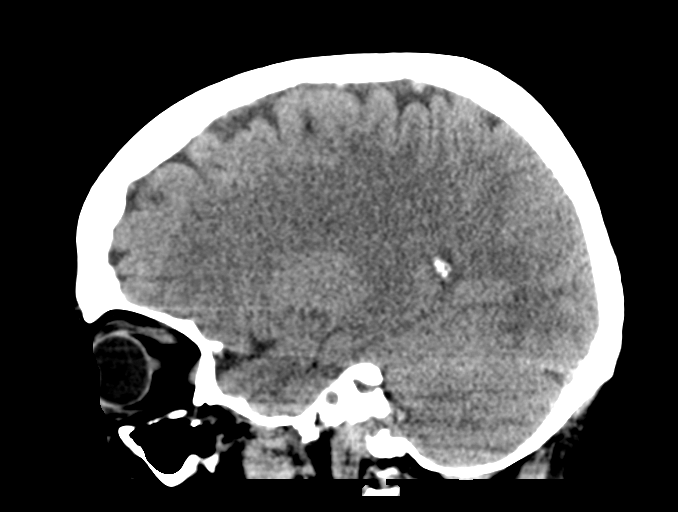
[im 31/61  brain]
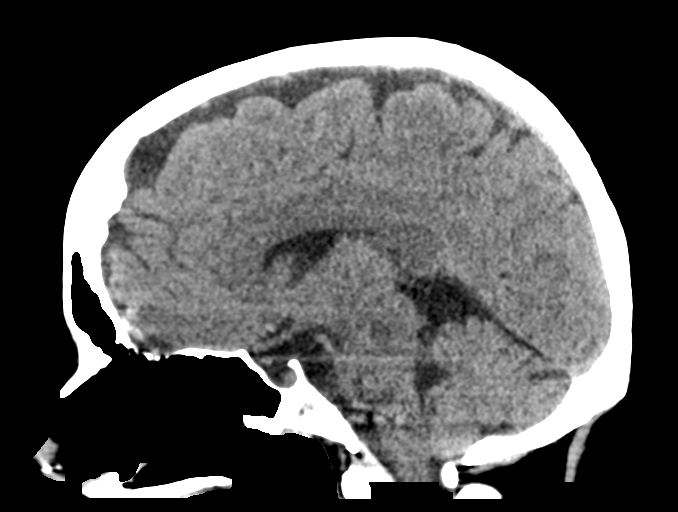
[im 41/61  brain]
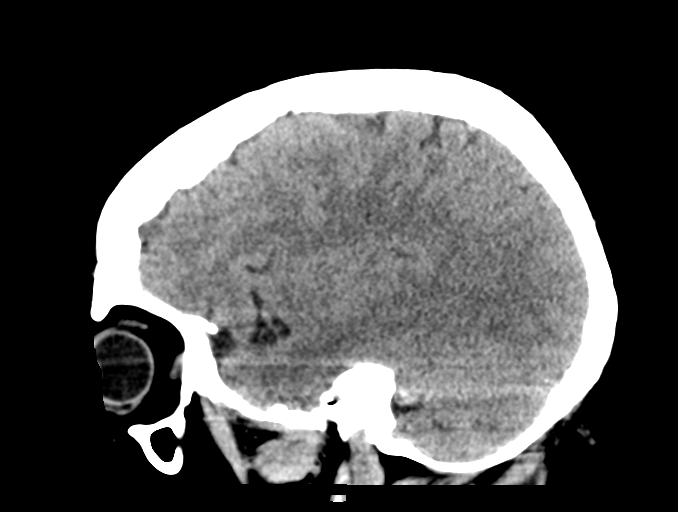

[15 of 47 positions shown; findings below may reference images not displayed]

FINDINGS: Brain: No evidence of acute infarction, hemorrhage, hydrocephalus,
extra-axial collection or mass lesion/mass effect.

Vascular: No hyperdense vessel or unexpected calcification.

Skull: Normal. Negative for fracture or focal lesion.

Sinuses/Orbits: No acute finding.

Other: None.
IMPRESSION: Unremarkable noncontrast head CT.

## 2021-09-10 MED ORDER — PREDNISONE 20 MG PO TABS: 60.0000 mg | ORAL_TABLET | Freq: Every day | ORAL | 0 refills | Status: DC

## 2021-09-10 MED ORDER — PREDNISONE 20 MG PO TABS
60.0000 mg | ORAL_TABLET | Freq: Once | ORAL | Status: AC
  Administered 2021-09-10: 60 mg via ORAL
  Filled 2021-09-10: qty 3

## 2021-09-10 MED ORDER — VALACYCLOVIR HCL 1 G PO TABS: 1000.0000 mg | ORAL_TABLET | Freq: Three times a day (TID) | ORAL | 0 refills | Status: DC

## 2021-09-10 MED ORDER — GADOBUTROL 1 MMOL/ML IV SOLN
10.0000 mL | Freq: Once | INTRAVENOUS | Status: AC | PRN
  Administered 2021-09-10: 10 mL via INTRAVENOUS

## 2021-09-10 MED ORDER — SODIUM CHLORIDE 0.9% FLUSH: 3.0000 mL | Freq: Once | INTRAVENOUS | Status: DC

## 2021-09-10 MED ORDER — VALACYCLOVIR HCL 500 MG PO TABS
1000.0000 mg | ORAL_TABLET | Freq: Once | ORAL | Status: AC
  Administered 2021-09-10: 1000 mg via ORAL
  Filled 2021-09-10: qty 2

## 2021-09-10 MED ORDER — HYPROMELLOSE (GONIOSCOPIC) 2.5 % OP SOLN: 1.0000 [drp] | OPHTHALMIC | 12 refills | Status: DC | PRN

## 2021-09-10 NOTE — Discharge Instructions (Signed)
You were seen in the ER for facial droop and numbness. ?MRI is negative for stroke.  As discussed, suspicion that you have Bell's palsy. ? ?Read instructions provided. ?Take the medications that are prescribed. ?Follow-up with your primary care doctor in 1 week. ? ?At nighttime, if you are unable to close your eyes, you might want to consider putting an eye patch to prevent any trauma. ? ?

## 2021-09-10 NOTE — ED Notes (Signed)
Pt ambulatory without assistance.  

## 2021-09-10 NOTE — ED Triage Notes (Signed)
Pt reports facial droop on the left side since around 12pm. LKN 9am. Pt reports she began feeling weird around 12 and went to gas station around 6pm and that is when facial droop was noticed.  ?

## 2021-09-10 NOTE — ED Notes (Signed)
MRI called 

## 2021-09-10 NOTE — ED Notes (Signed)
Not calling code stroke per MD will call teleneurology after CT per MD ?

## 2021-09-12 NOTE — ED Provider Notes (Signed)
?[facility] ?Provider Note ? ? ?[name]: [id number] ?Arrival date & time: 09/10/21  3710 ? ?  ? ?History ? ?Chief Complaint  ?Patient presents with  ? Facial Droop  ? ? ?Denise Collins is a 41 y.o. female. ? ?HPI ? ?  ? ?41 year old female comes in with chief complaint of facial droop. ?Last known normal is about 8 or 9 AM this morning.  Patient noted around noon time when she was having lunch, that she was having difficulty in chewing her food.  Later on in the day, when she met her friend, they noted facial droop. ? ?Patient has a left-sided facial droop.  She is also indicating that she is having numbness and tingling over the left lower extremity. ? ?No history of stroke.  Patient has history of depression, diabetes. ? ? ?Home Medications ?Prior to Admission medications   ?Medication Sig Start Date End Date Taking? Authorizing Provider  ?ALPRAZolam (XANAX) 1 MG tablet Take 1 mg by mouth at bedtime as needed for sleep.   Yes [provider]  ?amphetamine-dextroamphetamine (ADDERALL) 30 MG tablet Take 1 tablet by mouth 2 (two) times daily. 09/02/21  Yes [provider]  ?ARIPiprazole (ABILIFY) 10 MG tablet Take 10 mg by mouth at bedtime.   Yes [provider]  ?atorvastatin (LIPITOR) 80 MG tablet Take 80 mg by mouth at bedtime. 07/31/21  Yes [provider]  ?baclofen (LIORESAL) 10 MG tablet Take 10 mg by mouth at bedtime as needed for muscle spasms. 08/24/21  Yes [provider]  ?Glucagon HCl (GLUCAGON EMERGENCY) 1 MG/ML SOLR Inject 1 mg into the skin daily as needed (low blood sugar). 07/31/21  Yes [provider]  ?Cleda Clarks 200 UNIT/ML KwikPen Inject into the skin. Pump in left arm--Humalog 200 units 08/24/21  Yes [provider]  ?hydroxypropyl methylcellulose / hypromellose (ISOPTO TEARS / GONIOVISC) 2.5 % ophthalmic solution Place 1 drop into the left eye every 2 (two) hours as needed for dry eyes. 09/10/21  Yes  Varney Biles, MD  ?ibuprofen (ADVIL,MOTRIN) 200 MG tablet Take 200-400 mg by mouth every 6 (six) hours as needed for headache or moderate pain.   Yes [provider]  ?Insulin Human (INSULIN PUMP) SOLN Inject into the skin. Pump in left arm--Humalog 200 units   Yes [provider]  ?levothyroxine (SYNTHROID) 150 MCG tablet Take 150 mcg by mouth daily before breakfast.   Yes [provider]  ?OZEMPIC, 1 MG/DOSE, 4 MG/3ML SOPN Inject 1 mg into the skin once a week. 09/04/21  Yes [provider]  ?predniSONE (DELTASONE) 20 MG tablet Take 3 tablets (60 mg total) by mouth daily. 09/10/21  Yes Pasquale Matters, Thelma Comp, MD  ?QUVIVIQ 50 MG TABS Take 1 tablet by mouth at bedtime as needed (sleep). 08/15/21  Yes [provider]  ?valACYclovir (VALTREX) 1000 MG tablet Take 1 tablet (1,000 mg total) by mouth 3 (three) times daily. 09/10/21  Yes Varney Biles, MD  ?meclizine (ANTIVERT) 25 MG tablet Take 1 tablet (25 mg total) by mouth 3 (three) times daily as needed for dizziness. ?Patient not taking: Reported on 09/10/2021 06/02/20   Hall-Potvin, [place], PA-C  ?oxyCODONE-acetaminophen (PERCOCET/ROXICET) 5-325 MG tablet Take 1 tablet by mouth every 6 (six) hours as needed for up to 12 doses for severe pain. ?Patient not taking: Reported on 09/10/2021 03/01/21   Luna Fuse, MD  ?promethazine-dextromethorphan (PROMETHAZINE-DM) 6.25-15 MG/5ML syrup Take 5 mLs by mouth at bedtime as needed for cough. ?Patient  not taking: Reported on 09/10/2021 06/15/20   Jaynee Eagles, PA-C  ?sertraline (ZOLOFT) 100 MG tablet Take 2 tablets (200 mg total) by mouth daily. ?Patient not taking: Reported on 07/23/2014 06/17/12 03/03/20  Shawnee Knapp, MD  ?   ? ?Allergies    ?Imitrex [sumatriptan], Lunesta [eszopiclone], and Triptans   ? ?Review of Systems   ?Review of Systems  ?All other systems reviewed and are negative. ? ?Physical Exam ?Updated Vital Signs ?BP (!) 144/88 (BP Location: Left Arm)   Pulse 69   Temp 98.2  ?F (36.8 ?C) (Oral)   Resp 18   SpO2 100%  ?Physical Exam ?Vitals and nursing note reviewed.  ?Constitutional:   ?   Appearance: She is well-developed.  ?HENT:  ?   Head: Atraumatic.  ?Eyes:  ?   Extraocular Movements: Extraocular movements intact.  ?   Pupils: Pupils are equal, round, and reactive to light.  ?Cardiovascular:  ?   Rate and Rhythm: Normal rate.  ?Pulmonary:  ?   Effort: Pulmonary effort is normal.  ?Musculoskeletal:  ?   Cervical back: Normal range of motion and neck supple.  ?Skin: ?   General: Skin is warm and dry.  ?Neurological:  ?   Mental Status: She is alert and oriented to person, place, and time.  ?   Cranial Nerves: No cranial nerve deficit.  ?   Sensory: No sensory deficit.  ? ? ?ED Results / Procedures / Treatments   ?Labs ?(all labs ordered are listed, but only abnormal results are displayed) ?Labs Reviewed  ?CBC - Abnormal; Notable for the following components:  ?    Result Value  ? WBC 13.7 (*)   ? Platelets 407 (*)   ? All other components within normal limits  ?DIFFERENTIAL - Abnormal; Notable for the following components:  ? Neutro Abs 8.7 (*)   ? All other components within normal limits  ?COMPREHENSIVE METABOLIC PANEL - Abnormal; Notable for the following components:  ? Glucose, Bld 195 (*)   ? AST 13 (*)   ? All other components within normal limits  ?CBG MONITORING, ED - Abnormal; Notable for the following components:  ? Glucose-Capillary 190 (*)   ? All other components within normal limits  ?PROTIME-INR  ?APTT  ? ? ?EKG ?EKG Interpretation ? ?Date/Time:  Sunday September 10 2021 19:15:46 EDT ?Ventricular Rate:  90 ?PR Interval:  128 ?QRS Duration: 93 ?QT Interval:  368 ?QTC Calculation: 451 ?R Axis:   -48 ?Text Interpretation: Sinus rhythm LAD, consider left anterior fascicular block Abnormal R-wave progression, late transition No acute changes No significant change since last tracing Confirmed by Varney Biles [phone]) on 09/10/2021 8:02:41 PM ? ?Radiology ?CT Head Wo  Contrast ? ?Result Date: 09/10/2021 ?CLINICAL DATA:  41 year old female with LEFT facial droop and numbness. EXAM: CT HEAD WITHOUT CONTRAST TECHNIQUE: Contiguous axial images were obtained from the base of the skull through the vertex without intravenous contrast. RADIATION DOSE REDUCTION: This exam was performed according to the departmental dose-optimization program which includes automated exposure control, adjustment of the mA and/or kV according to patient size and/or use of iterative reconstruction technique. COMPARISON:  06/12/2006 brain MR FINDINGS: Brain: No evidence of acute infarction, hemorrhage, hydrocephalus, extra-axial collection or mass lesion/mass effect. Vascular: No hyperdense vessel or unexpected calcification. Skull: Normal. Negative for fracture or focal lesion. Sinuses/Orbits: No acute finding. Other: None. IMPRESSION: Unremarkable noncontrast head CT. Electronically Signed   By: Margarette Canada M.D.   On: 09/10/2021 19:58  ? ?  MR Brain W and Wo Contrast ? ?Result Date: 09/10/2021 ?CLINICAL DATA:  Left facial droop, stroke suspected EXAM: MRI HEAD WITHOUT AND WITH CONTRAST TECHNIQUE: Multiplanar, multiecho pulse sequences of the brain and surrounding structures were obtained without and with intravenous contrast. CONTRAST:  51m GADAVIST GADOBUTROL 1 MMOL/ML IV SOLN COMPARISON:  06/12/2006, correlation is also made with CT head 09/10/2021 FINDINGS: Brain: No restricted diffusion to suggest acute or subacute infarct. No acute hemorrhage, mass, mass effect, or midline shift. No hemosiderin deposition to suggest remote hemorrhage. No abnormal parenchymal or meningeal enhancement. No hydrocephalus or extra-axial collection. Scattered T2 hyperintense signal in the bilateral frontal lobe white matter. Vascular: Normal flow voids. Skull and upper cervical spine: Normal marrow signal. Sinuses/Orbits: No acute finding. Other: None. IMPRESSION: 1.  No acute intracranial process. 2. Scattered T2 hyperintense  signal in the bilateral frontal lobe white matter, which are nonspecific and can be seen in the setting of early small vessel ischemic disease or chronic headaches. These are not in a pattern concerning for demyelina

## 2021-10-01 ENCOUNTER — Other Ambulatory Visit: Payer: Self-pay

## 2021-10-01 ENCOUNTER — Ambulatory Visit (HOSPITAL_COMMUNITY)
Admission: EM | Admit: 2021-10-01 | Discharge: 2021-10-01 | Disposition: A | Discharge status: Home / Self Care | Payer: 59 | Attending: Urgent Care | Admitting: Urgent Care

## 2021-10-01 ENCOUNTER — Encounter (HOSPITAL_COMMUNITY): Payer: Self-pay | Admitting: Emergency Medicine

## 2021-10-01 DIAGNOSIS — E108 Type 1 diabetes mellitus with unspecified complications: Secondary | ICD-10-CM | POA: Diagnosis not present

## 2021-10-01 DIAGNOSIS — E109 Type 1 diabetes mellitus without complications: Secondary | ICD-10-CM

## 2021-10-01 DIAGNOSIS — H9202 Otalgia, left ear: Secondary | ICD-10-CM

## 2021-10-01 MED ORDER — ONDANSETRON 8 MG PO TBDP: 8.0000 mg | ORAL_TABLET | Freq: Three times a day (TID) | ORAL | 0 refills | Status: DC | PRN

## 2021-10-01 MED ORDER — NAPROXEN 500 MG PO TABS: 500.0000 mg | ORAL_TABLET | Freq: Two times a day (BID) | ORAL | 0 refills | Status: DC

## 2021-10-01 MED ORDER — CEFDINIR 300 MG PO CAPS: 300.0000 mg | ORAL_CAPSULE | Freq: Two times a day (BID) | ORAL | 0 refills | Status: DC

## 2021-10-01 MED ORDER — CETIRIZINE HCL 10 MG PO TABS: 10.0000 mg | ORAL_TABLET | Freq: Every day | ORAL | 0 refills | Status: DC

## 2021-10-01 NOTE — ED Provider Notes (Signed)
[name]   MRN: [id number] DOB: 1981/04/07  Subjective:   Denise Collins is a 41 y.o. female presenting for recheck on persistent left ear pain just behind her ear. Was started on Augmentin, then switched to doxycycline because patient was not able to tolerate GI side effects from the medication. Still has same symptoms.  Of note, she was treated for Bell's palsy at the end of April and completed her steroid treatment, antivirals.  Denies any history of herpes zoster, states that she was tested for this already and was shown to have never had it.  No ear drainage, history of ear infections, tinnitus, dizziness, facial pain, fevers.  No ear trauma.  Patient is a type I diabetic.  No current facility-administered medications for this encounter.  Current Outpatient Medications:    ALPRAZolam (XANAX) 1 MG tablet, Take 1 mg by mouth at bedtime as needed for sleep., Disp: , Rfl:    amphetamine-dextroamphetamine (ADDERALL) 30 MG tablet, Take 1 tablet by mouth 2 (two) times daily., Disp: , Rfl:    ARIPiprazole (ABILIFY) 10 MG tablet, Take 10 mg by mouth at bedtime., Disp: , Rfl:    atorvastatin (LIPITOR) 80 MG tablet, Take 80 mg by mouth at bedtime., Disp: , Rfl:    baclofen (LIORESAL) 10 MG tablet, Take 10 mg by mouth at bedtime as needed for muscle spasms., Disp: , Rfl:    Glucagon HCl (GLUCAGON EMERGENCY) 1 MG/ML SOLR, Inject 1 mg into the skin daily as needed (low blood sugar)., Disp: , Rfl:    HUMALOG KWIKPEN 200 UNIT/ML KwikPen, Inject into the skin. Pump in left arm--Humalog 200 units, Disp: , Rfl:    hydroxypropyl methylcellulose / hypromellose (ISOPTO TEARS / GONIOVISC) 2.5 % ophthalmic solution, Place 1 drop into the left eye every 2 (two) hours as needed for dry eyes., Disp: 15 mL, Rfl: 12   ibuprofen (ADVIL,MOTRIN) 200 MG tablet, Take 200-400 mg by mouth every 6 (six) hours as needed for headache or moderate pain., Disp: , Rfl:    Insulin Human (INSULIN PUMP) SOLN,  Inject into the skin. Pump in left arm--Humalog 200 units, Disp: , Rfl:    levothyroxine (SYNTHROID) 150 MCG tablet, Take 150 mcg by mouth daily before breakfast., Disp: , Rfl:    meclizine (ANTIVERT) 25 MG tablet, Take 1 tablet (25 mg total) by mouth 3 (three) times daily as needed for dizziness. (Patient not taking: Reported on 09/10/2021), Disp: 30 tablet, Rfl: 0   oxyCODONE-acetaminophen (PERCOCET/ROXICET) 5-325 MG tablet, Take 1 tablet by mouth every 6 (six) hours as needed for up to 12 doses for severe pain. (Patient not taking: Reported on 09/10/2021), Disp: 12 tablet, Rfl: 0   OZEMPIC, 1 MG/DOSE, 4 MG/3ML SOPN, Inject 1 mg into the skin once a week., Disp: , Rfl:    predniSONE (DELTASONE) 20 MG tablet, Take 3 tablets (60 mg total) by mouth daily. (Patient not taking: Reported on 10/01/2021), Disp: 18 tablet, Rfl: 0   promethazine-dextromethorphan (PROMETHAZINE-DM) 6.25-15 MG/5ML syrup, Take 5 mLs by mouth at bedtime as needed for cough. (Patient not taking: Reported on 09/10/2021), Disp: 100 mL, Rfl: 0   QUVIVIQ 50 MG TABS, Take 1 tablet by mouth at bedtime as needed (sleep)., Disp: , Rfl:    valACYclovir (VALTREX) 1000 MG tablet, Take 1 tablet (1,000 mg total) by mouth 3 (three) times daily., Disp: 21 tablet, Rfl: 0   Allergies  Allergen Reactions   Imitrex [Sumatriptan]     Worsens headache  Lunesta [Eszopiclone]     Severe Hallucinations   Triptans     Worsens headache    Past Medical History:  Diagnosis Date   Allergy    Anemia    Anxiety    Asthma    Depression    Diabetes mellitus without complication (HCC)    Migraine    Thyroid disease      Past Surgical History:  Procedure Laterality Date   ABDOMINAL HYSTERECTOMY     LAPAROSCOPY ABDOMEN DIAGNOSTIC     TONSILLECTOMY      Family History  Problem Relation Age of Onset   Cancer Mother    Heart disease Father    Hypertension Father    Diabetes Father     Social History   Tobacco Use   Smoking status: Every  Day    Packs/day: 1.00    Years: 20.00    Pack years: 20.00    Types: Cigarettes   Smokeless tobacco: Never  Vaping Use   Vaping Use: Never used  Substance Use Topics   Alcohol use: Yes    Comment: weekly   Drug use: No    ROS   Objective:   Vitals: BP (!) 137/92 (BP Location: Right Arm) Comment (BP Location): large cuff  Pulse 85   Temp 98.6 F (37 C) (Oral)   Resp (!) 22   SpO2 96%   Physical Exam Constitutional:      General: She is not in acute distress.    Appearance: Normal appearance. She is well-developed and normal weight. She is not ill-appearing, toxic-appearing or diaphoretic.  HENT:     Head: Normocephalic and atraumatic.      Right Ear: Ear canal and external ear normal. No drainage or tenderness. No middle ear effusion. There is no impacted cerumen. Tympanic membrane is not erythematous.     Left Ear: Ear canal and external ear normal. No drainage or tenderness.  No middle ear effusion. There is no impacted cerumen. Tympanic membrane is not erythematous.     Ears:     Comments: TMs opacified bilaterally.    Nose: No congestion or rhinorrhea.     Mouth/Throat:     Mouth: Mucous membranes are moist. No oral lesions.     Pharynx: No pharyngeal swelling, oropharyngeal exudate, posterior oropharyngeal erythema or uvula swelling.     Tonsils: No tonsillar exudate or tonsillar abscesses.  Eyes:     General: No scleral icterus.       Right eye: No discharge.        Left eye: No discharge.     Extraocular Movements: Extraocular movements intact.     Right eye: Normal extraocular motion.     Left eye: Normal extraocular motion.     Conjunctiva/sclera: Conjunctivae normal.  Cardiovascular:     Rate and Rhythm: Normal rate.  Pulmonary:     Effort: Pulmonary effort is normal.  Musculoskeletal:     Cervical back: Normal range of motion and neck supple.  Lymphadenopathy:     Cervical: No cervical adenopathy.  Skin:    General: Skin is warm and dry.   Neurological:     General: No focal deficit present.     Mental Status: She is alert and oriented to person, place, and time.  Psychiatric:        Mood and Affect: Mood normal.        Behavior: Behavior normal.    Assessment and Plan :   PDMP not reviewed this encounter.  1.  Left ear pain   2. Type 1 diabetes mellitus without complication (HCC)    Recommended stopping the doxycycline as this does not help with your infections.  She also does not have a sign of cellulitis.  Low suspicion for herpes zoster oticus or mastoiditis.  Recommended trying cefdinir to cover for a possible middle/inner ear infection.  She can use Zofran if she has GI side effects with it.  Continue to use pseudoephedrine and Zyrtec.  Use naproxen for the pain and inflammation.  Follow-up with ENT otherwise. Counseled patient on potential for adverse effects with medications prescribed/recommended today, ER and return-to-clinic precautions discussed, patient verbalized understanding.    Wallis BambergMani, Jaaron Oleson, PA-C 10/01/21 1747

## 2021-10-01 NOTE — ED Triage Notes (Signed)
Diagnosed with bells palsey 3 weeks ago.  Left side of face involvement.  Then patient was seen and diagnosed with left ear infection and was started on antibiotic (Monday).  Then on Wednesday, antibiotic was changed to doxycycline.  Patient does not think ear is getting any better.  Reports left side of face is extremely painful

## 2021-11-30 DIAGNOSIS — E538 Deficiency of other specified B group vitamins: Secondary | ICD-10-CM | POA: Insufficient documentation

## 2021-12-11 DIAGNOSIS — J302 Other seasonal allergic rhinitis: Secondary | ICD-10-CM | POA: Insufficient documentation

## 2021-12-18 DIAGNOSIS — E611 Iron deficiency: Secondary | ICD-10-CM | POA: Insufficient documentation

## 2022-09-25 ENCOUNTER — Ambulatory Visit (HOSPITAL_COMMUNITY)
Admission: EM | Admit: 2022-09-25 | Discharge: 2022-09-25 | Disposition: A | Discharge status: Home / Self Care | Payer: 59 | Attending: Family Medicine | Admitting: Family Medicine

## 2022-09-25 ENCOUNTER — Ambulatory Visit (INDEPENDENT_AMBULATORY_CARE_PROVIDER_SITE_OTHER): Payer: 59

## 2022-09-25 ENCOUNTER — Encounter (HOSPITAL_COMMUNITY): Payer: Self-pay

## 2022-09-25 DIAGNOSIS — R0781 Pleurodynia: Secondary | ICD-10-CM

## 2022-09-25 DIAGNOSIS — R0789 Other chest pain: Secondary | ICD-10-CM | POA: Diagnosis not present

## 2022-09-25 MED ORDER — CYCLOBENZAPRINE HCL 10 MG PO TABS: 10.0000 mg | ORAL_TABLET | Freq: Two times a day (BID) | ORAL | 0 refills | Status: DC | PRN

## 2022-09-25 MED ORDER — KETOROLAC TROMETHAMINE 30 MG/ML IJ SOLN
INTRAMUSCULAR | Status: AC
  Filled 2022-09-25: qty 1

## 2022-09-25 MED ORDER — KETOROLAC TROMETHAMINE 30 MG/ML IJ SOLN
30.0000 mg | Freq: Once | INTRAMUSCULAR | Status: AC
  Administered 2022-09-25: 30 mg via INTRAMUSCULAR

## 2022-09-25 NOTE — ED Triage Notes (Signed)
Onset Saturday afternoon Patient was working at [employer]. Was leaned over in the car pushing something while pushing something else. Felt a pop in the left chest and pain is now going into the back. Reduced range of motion with twisting the upper body, Patient has to sit up straight and still to avoid pain.

## 2022-09-25 NOTE — ED Provider Notes (Signed)
[facility]    [name]: [id number] Arrival date & time: 09/25/22  1023      History   Chief Complaint Chief Complaint  Patient presents with   Back Pain   Chest Pain    HPI Denise Collins is a 42 y.o. female.   Patient is here for chest/back pain.  4 days ago she was pulling something out of her car, and felt a pop in the right rib area, and thought she pulled something.  Last night she unloaded a lot of camping gear out of her car and then had severe pain at the ribs/chest/back area.  It also feels that it is pulling in the back in the same area.  Painful with any movement.  If sitting still she is okay.  No n/v.  She did take tylelnol without much help.        Past Medical History:  Diagnosis Date   Allergy    Anemia    Anxiety    Asthma    Depression    Diabetes mellitus without complication (HCC)    Migraine    Thyroid disease     Patient Active Problem List   Diagnosis Date Noted   Thoracic back sprain 06/04/2012    Past Surgical History:  Procedure Laterality Date   ABDOMINAL HYSTERECTOMY     LAPAROSCOPY ABDOMEN DIAGNOSTIC     TONSILLECTOMY      OB History   No obstetric history on file.      Home Medications    Prior to Admission medications   Medication Sig Start Date End Date Taking? Authorizing Provider  ALPRAZolam Prudy Feeler) 1 MG tablet Take 1 mg by mouth at bedtime as needed for sleep.   Yes [provider]  amphetamine-dextroamphetamine (ADDERALL) 30 MG tablet Take 1 tablet by mouth 2 (two) times daily. 09/02/21  Yes [provider]  ARIPiprazole (ABILIFY) 10 MG tablet Take 10 mg by mouth at bedtime.   Yes [provider]  atorvastatin (LIPITOR) 80 MG tablet Take 80 mg by mouth at bedtime. 07/31/21  Yes [provider]  BAQSIMI TWO PACK 3 MG/DOSE POWD Place into both nostrils.   Yes [provider]  HUMALOG KWIKPEN 200 UNIT/ML KwikPen Inject into the skin. Pump in left arm--Humalog 200  units 08/24/21  Yes [provider]  Insulin Human (INSULIN PUMP) SOLN Inject into the skin. Pump in left arm--Humalog 200 units   Yes [provider]  levothyroxine (SYNTHROID) 150 MCG tablet Take 150 mcg by mouth daily before breakfast.   Yes [provider]  MOUNJARO 5 MG/0.5ML Pen SMARTSIG:0.5 Milliliter(s) SUB-Q Once a Week   Yes [provider]  baclofen (LIORESAL) 10 MG tablet Take 10 mg by mouth at bedtime as needed for muscle spasms. 08/24/21   [provider]  cefdinir (OMNICEF) 300 MG capsule Take 1 capsule (300 mg total) by mouth 2 (two) times daily. 10/01/21   Wallis Bamberg, PA-C  cetirizine (ZYRTEC ALLERGY) 10 MG tablet Take 1 tablet (10 mg total) by mouth daily. 10/01/21   Wallis Bamberg, PA-C  hydroxypropyl methylcellulose / hypromellose (ISOPTO TEARS / GONIOVISC) 2.5 % ophthalmic solution Place 1 drop into the left eye every 2 (two) hours as needed for dry eyes. 09/10/21   Derwood Kaplan, MD  ibuprofen (ADVIL,MOTRIN) 200 MG tablet Take 200-400 mg by mouth every 6 (six) hours as needed for headache or moderate pain.    [provider]  meclizine (ANTIVERT) 25 MG tablet Take  1 tablet (25 mg total) by mouth 3 (three) times daily as needed for dizziness. 06/02/20   Hall-Potvin, [place], PA-C  naproxen (NAPROSYN) 500 MG tablet Take 1 tablet (500 mg total) by mouth 2 (two) times daily with a meal. 10/01/21   Wallis Bamberg, PA-C  ondansetron (ZOFRAN-ODT) 8 MG disintegrating tablet Take 1 tablet (8 mg total) by mouth every 8 (eight) hours as needed for nausea or vomiting. 10/01/21   Wallis Bamberg, PA-C  oxyCODONE-acetaminophen (PERCOCET/ROXICET) 5-325 MG tablet Take 1 tablet by mouth every 6 (six) hours as needed for up to 12 doses for severe pain. 03/01/21   Cheryll Cockayne, MD  predniSONE (DELTASONE) 20 MG tablet Take 3 tablets (60 mg total) by mouth daily. 09/10/21   Derwood Kaplan, MD  promethazine-dextromethorphan (PROMETHAZINE-DM) 6.25-15 MG/5ML  syrup Take 5 mLs by mouth at bedtime as needed for cough. 06/15/20   Wallis Bamberg, PA-C  QUVIVIQ 50 MG TABS Take 1 tablet by mouth at bedtime as needed (sleep). 08/15/21   [provider]  valACYclovir (VALTREX) 1000 MG tablet Take 1 tablet (1,000 mg total) by mouth 3 (three) times daily. 09/10/21   Derwood Kaplan, MD  sertraline (ZOLOFT) 100 MG tablet Take 2 tablets (200 mg total) by mouth daily. Patient not taking: Reported on 07/23/2014 06/17/12 03/03/20  Sherren Mocha, MD    Family History Family History  Problem Relation Age of Onset   Cancer Mother    Heart disease Father    Hypertension Father    Diabetes Father     Social History Social History   Tobacco Use   Smoking status: Every Day    Packs/day: 1.00    Years: 20.00    Additional pack years: 0.00    Total pack years: 20.00    Types: Cigarettes   Smokeless tobacco: Never  Vaping Use   Vaping Use: Never used  Substance Use Topics   Alcohol use: Yes    Comment: weekly   Drug use: No     Allergies   Eszopiclone, Zolpidem, Imitrex [sumatriptan], and Triptans   Review of Systems Review of Systems  Constitutional: Negative.   HENT: Negative.    Respiratory: Negative.    Cardiovascular:  Positive for chest pain.  Gastrointestinal: Negative.   Genitourinary: Negative.   Musculoskeletal:  Positive for back pain.     Physical Exam Triage Vital Signs ED Triage Vitals  Enc Vitals Group     BP 09/25/22 1134 (!) 158/95     Pulse Rate 09/25/22 1134 65     Resp --      Temp 09/25/22 1134 97.6 F (36.4 C)     Temp Source 09/25/22 1134 Oral     SpO2 09/25/22 1134 96 %     Weight --      Height --      Head Circumference --      Peak Flow --      Pain Score 09/25/22 1130 6     Pain Loc --      Pain Edu? --      Excl. in GC? --    No data found.  Updated Vital Signs BP (!) 158/95 (BP Location: Right Arm)   Pulse 65   Temp 97.6 F (36.4 C) (Oral)   SpO2 96%   Visual Acuity Right Eye Distance:    Left Eye Distance:   Bilateral Distance:    Right Eye Near:   Left Eye Near:    Bilateral Near:  Physical Exam Constitutional:      General: She is not in acute distress.    Appearance: She is well-developed. She is not ill-appearing.     Comments: Pain with every movement  Cardiovascular:     Rate and Rhythm: Normal rate and regular rhythm.     Heart sounds: Normal heart sounds.  Pulmonary:     Effort: Pulmonary effort is normal.     Breath sounds: Normal breath sounds.  Chest:     Chest wall: Tenderness present.     Comments: TTP to the right ribs around the right breast;  TTP to the right edge of the sternum Musculoskeletal:     Comments: No TTP to the back/rib area;  no TTP to the right shoulder  Neurological:     Mental Status: She is alert.      UC Treatments / Results  Labs (all labs ordered are listed, but only abnormal results are displayed) Labs Reviewed - No data to display  EKG   Radiology No results found.  Procedures Procedures (including critical care time)  Medications Ordered in UC Medications  ketorolac (TORADOL) 30 MG/ML injection 30 mg (has no administration in time range)    Initial Impression / Assessment and Plan / UC Course  I have reviewed the triage vital signs and the nursing notes.  Pertinent labs & imaging results that were available during my care of the patient were reviewed by me and considered in my medical decision making (see chart for details).   Final Clinical Impressions(s) / UC Diagnoses   Final diagnoses:  Chest wall pain  Rib pain on right side     Discharge Instructions      You were seen today for chest wall/rib pain.  Your xray appears normal.  If radiology sees something on there I missed, we will call and notify you.  This is likely a strain/pull of tendons/ligaments in your chest wall.  I have sent out a muscle relaxer.  This may make you tired/sleepy so please take when home and not driving.  You  should also take motrin for pain. You may trial heat or ice to see if either are helpful for pain.  If not improving or worsening then please return for re-evaluation.      ED Prescriptions     Medication Sig Dispense Auth. Provider   cyclobenzaprine (FLEXERIL) 10 MG tablet Take 1 tablet (10 mg total) by mouth 2 (two) times daily as needed for muscle spasms. 20 tablet Jannifer Franklin, MD      PDMP not reviewed this encounter.   Jannifer Franklin, MD 09/25/22 1259

## 2022-09-25 NOTE — Discharge Instructions (Addendum)
You were seen today for chest wall/rib pain.  Your xray appears normal.  If radiology sees something on there I missed, we will call and notify you.  This is likely a strain/pull of tendons/ligaments in your chest wall.  I have sent out a muscle relaxer.  This may make you tired/sleepy so please take when home and not driving.  You should also take motrin for pain. You may trial heat or ice to see if either are helpful for pain.  If not improving or worsening then please return for re-evaluation.

## 2023-02-05 ENCOUNTER — Ambulatory Visit
Admission: EM | Admit: 2023-02-05 | Discharge: 2023-02-05 | Disposition: A | Discharge status: Home / Self Care | Payer: 59

## 2023-02-05 DIAGNOSIS — E1165 Type 2 diabetes mellitus with hyperglycemia: Secondary | ICD-10-CM | POA: Insufficient documentation

## 2023-02-05 DIAGNOSIS — F17210 Nicotine dependence, cigarettes, uncomplicated: Secondary | ICD-10-CM | POA: Insufficient documentation

## 2023-02-05 DIAGNOSIS — H6993 Unspecified Eustachian tube disorder, bilateral: Secondary | ICD-10-CM | POA: Diagnosis not present

## 2023-02-05 DIAGNOSIS — J069 Acute upper respiratory infection, unspecified: Secondary | ICD-10-CM | POA: Diagnosis not present

## 2023-02-05 DIAGNOSIS — Z1152 Encounter for screening for COVID-19: Secondary | ICD-10-CM | POA: Diagnosis not present

## 2023-02-05 DIAGNOSIS — H9209 Otalgia, unspecified ear: Secondary | ICD-10-CM | POA: Diagnosis present

## 2023-02-05 MED ORDER — FLUTICASONE PROPIONATE 50 MCG/ACT NA SUSP: 1.0000 | Freq: Every day | NASAL | 0 refills | Status: DC

## 2023-02-05 NOTE — ED Triage Notes (Signed)
"  I had an ear ache about 2 wks ago and it went away but now it is back".   "Also my blood sugars are all over the place and my glucose is bottoming out often". "I have not reached out to my Endocrinologist (Triage Endocrinology)".

## 2023-02-05 NOTE — ED Provider Notes (Signed)
[name]    [name]: [id number] Arrival date & time: 02/05/23  1750      History   Chief Complaint Chief Complaint  Patient presents with   Glucose Problems   Otalgia    HPI Denise Collins is a 42 y.o. female.   Patient here today for evaluation of bilateral ear pain that started about 2 weeks ago, resolved and then returned.  She states that she has also had congestion and cough.  She is unsure if she has had fever but states she has had night sweats.  She denies any vomiting or diarrhea.  She also reports that her glucose levels have been all over the place.  She states that she has not discussed same with endocrinology as of yet.  The history is provided by the patient.  Otalgia Associated symptoms: congestion, cough, fever and rhinorrhea   Associated symptoms: no diarrhea and no vomiting     Past Medical History:  Diagnosis Date   Allergy    Anemia    Anxiety    Asthma    Depression    Diabetes mellitus without complication (HCC)    Migraine    Thyroid disease     Patient Active Problem List   Diagnosis Date Noted   Iron deficiency 12/18/2021   Seasonal allergies 12/11/2021   Vitamin B12 deficiency 11/30/2021   Carpal tunnel syndrome, bilateral 08/02/2021   Background diabetic retinopathy (HCC) 07/20/2019   Chronic fatigue 02/10/2019   Weight gain 02/10/2019   Smoker 08/13/2017   Essential hypertension 03/28/2017   Excessive daytime sleepiness 03/28/2017   Major depressive disorder, recurrent episode, moderate (HCC) 03/28/2017   Snoring 03/28/2017   Suicidal ideations 02/15/2016   Morbid obesity with BMI of 40.0-44.9, adult (HCC) 12/08/2015   Chest pain syndrome 09/07/2014   Palpitations 08/31/2014   Ventricular ectopy 08/31/2014   Ventricular premature depolarization 08/31/2014   Adiposity 07/19/2014   Hyperlipidemia 07/19/2014   Vitamin D deficiency 07/19/2014   Absence of both cervix and uterus, acquired 06/24/2014   History of  hysterectomy 06/24/2014   Mental disorder 02/10/2014   Acute upper respiratory infection 07/20/2013   Eustachian tube dysfunction 07/20/2013   Malaise and fatigue 07/20/2013   Type II diabetes mellitus (HCC) 07/20/2013   Thoracic back sprain 06/04/2012   Endometriosis 02/01/2011   Anxiety 02/01/2011   Depression, unspecified 02/01/2011   Generalized anxiety disorder 02/01/2011   Insomnia 02/01/2011   Other seasonal allergic rhinitis 02/01/2011   Type I diabetes mellitus (HCC) 05/15/2007   Hypothyroidism 05/14/2005   Migraine, unspecified, not intractable, without status migrainosus 05/14/1998    Past Surgical History:  Procedure Laterality Date   ABDOMINAL HYSTERECTOMY     LAPAROSCOPY ABDOMEN DIAGNOSTIC     TONSILLECTOMY      OB History   No obstetric history on file.      Home Medications    Prior to Admission medications   Medication Sig Start Date End Date Taking? Authorizing Provider  albuterol (ACCUNEB) 0.63 MG/3ML nebulizer solution Take 1 ampule by nebulization every 6 (six) hours as needed for wheezing or shortness of breath. 07/04/21  Yes [provider]  albuterol (VENTOLIN HFA) 108 (90 Base) MCG/ACT inhaler Inhale 2 puffs into the lungs every 4 (four) hours as needed for wheezing or shortness of breath. 02/17/16  Yes [provider]  amphetamine-dextroamphetamine (ADDERALL) 30 MG tablet Take 1 tablet by mouth 2 (two) times daily. 09/02/21  Yes [provider]  ARIPiprazole (ABILIFY) 10 MG tablet  Take 10 mg by mouth at bedtime.   Yes [provider]  atorvastatin (LIPITOR) 80 MG tablet Take 80 mg by mouth at bedtime. 07/31/21  Yes [provider]  butalbital-acetaminophen-caffeine (FIORICET) 50-325-40 MG tablet Take 2 tablets by mouth 2 (two) times daily. 03/09/13  Yes [provider]  carbamazepine (TEGRETOL) 100 MG chewable tablet Chew 100 mg by mouth 3 (three) times daily. 02/17/16  Yes [provider]   Cetirizine HCl 10 MG CAPS Take 1 capsule by mouth daily. 12/11/21  Yes [provider]  Cholecalciferol 125 MCG (5000 UT) TABS Take 5,000 Units by mouth as directed. 02/01/11  Yes [provider]  Cholecalciferol 50 MCG (2000 UT) TABS Take 2 tablets by mouth daily. 12/11/21  Yes [provider]  Continuous Glucose Sensor (DEXCOM G6 SENSOR) MISC Apply topically as directed.   Yes [provider]  Continuous Glucose Transmitter (DEXCOM G6 TRANSMITTER) MISC See admin instructions.   Yes [provider]  cyanocobalamin (VITAMIN B12) 1000 MCG tablet Take 1,000 mcg by mouth daily. 12/11/21  Yes [provider]  fluticasone (FLONASE) 50 MCG/ACT nasal spray Place 1 spray into both nostrils daily. 02/05/23  Yes Tomi Bamberger, PA-C  HUMALOG KWIKPEN 200 UNIT/ML KwikPen Inject into the skin. Pump in left arm--Humalog 200 units, every 3 days. 08/24/21  Yes [provider]  levothyroxine (SYNTHROID) 150 MCG tablet Take 150 mcg by mouth daily before breakfast.   Yes [provider]  ALPRAZolam (XANAX) 1 MG tablet Take 1 mg by mouth at bedtime as needed for sleep.    [provider]  baclofen (LIORESAL) 10 MG tablet Take 10 mg by mouth daily at 6 (six) AM.    [provider]  BAQSIMI TWO PACK 3 MG/DOSE POWD Place into both nostrils.    [provider]  cyclobenzaprine (FLEXERIL) 10 MG tablet Take 1 tablet (10 mg total) by mouth 2 (two) times daily as needed for muscle spasms. 09/25/22   Piontek, Denny Peon, MD  Insulin Human (INSULIN PUMP) SOLN Inject into the skin. Pump in left arm--Humalog 200 units    [provider]  MOUNJARO 5 MG/0.5ML Pen SMARTSIG:0.5 Milliliter(s) SUB-Q Once a Week    [provider]  QUVIVIQ 50 MG TABS Take 1 tablet by mouth at bedtime as needed (sleep). 08/15/21   [provider]  sertraline (ZOLOFT) 100 MG tablet Take 2 tablets (200 mg total) by mouth daily. Patient not  taking: Reported on 07/23/2014 06/17/12 03/03/20  Sherren Mocha, MD    Family History Family History  Problem Relation Age of Onset   Cancer Mother    Heart disease Father    Hypertension Father    Diabetes Father     Social History Social History   Tobacco Use   Smoking status: Every Day    Current packs/day: 1.00    Average packs/day: 1 pack/day for 20.0 years (20.0 ttl pk-yrs)    Types: Cigarettes   Smokeless tobacco: Never  Vaping Use   Vaping status: Never Used  Substance Use Topics   Alcohol use: Yes    Comment: weekly   Drug use: No     Allergies   Eszopiclone, Other, Sumatriptan, Triptans, and Zolpidem   Review of Systems Review of Systems  Constitutional:  Positive for fever.  HENT:  Positive for congestion, ear pain and rhinorrhea.   Eyes:  Negative for discharge and redness.  Respiratory:  Positive for cough. Negative for shortness of breath.  Gastrointestinal:  Negative for diarrhea, nausea and vomiting.     Physical Exam Triage Vital Signs ED Triage Vitals  Encounter Vitals Group     BP 02/05/23 1820 (!) 169/94     Systolic BP Percentile --      Diastolic BP Percentile --      Pulse Rate 02/05/23 1820 93     Resp 02/05/23 1820 20     Temp 02/05/23 1820 98.9 F (37.2 C)     Temp Source 02/05/23 1820 Oral     SpO2 02/05/23 1820 96 %     Weight 02/05/23 1823 225 lb (102.1 kg)     Height 02/05/23 1823 5' (1.524 m)     Head Circumference --      Peak Flow --      Pain Score 02/05/23 1823 3     Pain Loc --      Pain Education --      Exclude from Growth Chart --    No data found.  Updated Vital Signs BP (!) 144/89 (BP Location: Right Arm)   Pulse 75   Temp 98.9 F (37.2 C) (Oral)   Resp 20   Ht 5' (1.524 m)   Wt 225 lb (102.1 kg)   SpO2 96%   BMI 43.94 kg/m      Physical Exam Vitals and nursing note reviewed.  Constitutional:      General: She is not in acute distress.    Appearance: Normal appearance. She is not ill-appearing.   HENT:     Head: Normocephalic and atraumatic.     Ears:     Comments: Bilateral TMs dull    Nose: Congestion present.     Mouth/Throat:     Mouth: Mucous membranes are moist.     Pharynx: No oropharyngeal exudate or posterior oropharyngeal erythema.  Eyes:     Conjunctiva/sclera: Conjunctivae normal.  Cardiovascular:     Rate and Rhythm: Normal rate and regular rhythm.     Heart sounds: Normal heart sounds. No murmur heard. Pulmonary:     Effort: Pulmonary effort is normal. No respiratory distress.     Breath sounds: Normal breath sounds. No wheezing, rhonchi or rales.  Skin:    General: Skin is warm and dry.  Neurological:     Mental Status: She is alert.  Psychiatric:        Mood and Affect: Mood normal.        Thought Content: Thought content normal.      UC Treatments / Results  Labs (all labs ordered are listed, but only abnormal results are displayed) Labs Reviewed  SARS CORONAVIRUS 2 (TAT 6-24 HRS)    EKG   Radiology No results found.  Procedures Procedures (including critical care time)  Medications Ordered in UC Medications - No data to display  Initial Impression / Assessment and Plan / UC Course  I have reviewed the triage vital signs and the nursing notes.  Pertinent labs & imaging results that were available during my care of the patient were reviewed by me and considered in my medical decision making (see chart for details).    COVID screening ordered.  Suspect likely viral etiology of symptoms.  Flonase prescribed to hopefully help with what is suspected to be eustachian tube dysfunction given ear discomfort.  Recommended follow-up with endocrinology regarding glucose levels.  Encouraged follow-up sooner with any further concerns.  Final Clinical Impressions(s) / UC Diagnoses   Final diagnoses:  Viral upper respiratory tract infection  ETD (Eustachian tube dysfunction), bilateral   Discharge Instructions   None    ED Prescriptions      Medication Sig Dispense Auth. Provider   fluticasone (FLONASE) 50 MCG/ACT nasal spray Place 1 spray into both nostrils daily. 15.8 mL Tomi Bamberger, PA-C      PDMP not reviewed this encounter.   Tomi Bamberger, PA-C 02/05/23 1918

## 2023-02-06 LAB — SARS CORONAVIRUS 2 (TAT 6-24 HRS): SARS Coronavirus 2: NEGATIVE

## 2023-04-16 ENCOUNTER — Ambulatory Visit
Admission: EM | Admit: 2023-04-16 | Discharge: 2023-04-16 | Disposition: A | Discharge status: Home / Self Care | Payer: 59

## 2023-04-16 DIAGNOSIS — H1033 Unspecified acute conjunctivitis, bilateral: Secondary | ICD-10-CM | POA: Diagnosis not present

## 2023-04-16 DIAGNOSIS — J019 Acute sinusitis, unspecified: Secondary | ICD-10-CM

## 2023-04-16 MED ORDER — AMOXICILLIN-POT CLAVULANATE 875-125 MG PO TABS: 1.0000 | ORAL_TABLET | Freq: Two times a day (BID) | ORAL | 0 refills | Status: DC

## 2023-04-16 MED ORDER — PROMETHAZINE-DM 6.25-15 MG/5ML PO SYRP: 5.0000 mL | ORAL_SOLUTION | Freq: Four times a day (QID) | ORAL | 0 refills | Status: DC | PRN

## 2023-04-16 MED ORDER — POLYMYXIN B-TRIMETHOPRIM 10000-0.1 UNIT/ML-% OP SOLN: 1.0000 [drp] | OPHTHALMIC | 0 refills | Status: AC

## 2023-04-16 NOTE — ED Provider Notes (Signed)
[name]    [name]: [id number] Arrival date & time: 04/16/23  0931      History   Chief Complaint Chief Complaint  Patient presents with   Sore Throat   Otalgia   Eye Problem    HPI Denise Collins is a 42 y.o. female.   Patient here today for evaluation of sore throat that started about a week ago.  She has also had congestion, sinus pressure and pain and ear pain.  This morning she woke up with pink eyes.  She has not any fever.  She does report some cough.  The history is provided by the patient.  Sore Throat Pertinent negatives include no abdominal pain and no shortness of breath.  Otalgia Associated symptoms: congestion, cough and sore throat   Associated symptoms: no abdominal pain, no diarrhea, no fever and no vomiting   Eye Problem Associated symptoms: no discharge, no nausea, no redness and no vomiting     Past Medical History:  Diagnosis Date   Allergy    Anemia    Anxiety    Asthma    Depression    Diabetes mellitus without complication (HCC)    Migraine    Thyroid disease     Patient Active Problem List   Diagnosis Date Noted   Iron deficiency 12/18/2021   Seasonal allergies 12/11/2021   Vitamin B12 deficiency 11/30/2021   Carpal tunnel syndrome, bilateral 08/02/2021   Background diabetic retinopathy (HCC) 07/20/2019   Chronic fatigue 02/10/2019   Weight gain 02/10/2019   Smoker 08/13/2017   Essential hypertension 03/28/2017   Excessive daytime sleepiness 03/28/2017   Major depressive disorder, recurrent episode, moderate (HCC) 03/28/2017   Snoring 03/28/2017   Suicidal ideations 02/15/2016   Morbid obesity with BMI of 40.0-44.9, adult (HCC) 12/08/2015   Chest pain syndrome 09/07/2014   Palpitations 08/31/2014   Ventricular ectopy 08/31/2014   Ventricular premature depolarization 08/31/2014   Adiposity 07/19/2014   Hyperlipidemia 07/19/2014   Vitamin D deficiency 07/19/2014   Absence of both cervix and uterus, acquired  06/24/2014   History of hysterectomy 06/24/2014   Mental disorder 02/10/2014   Acute upper respiratory infection 07/20/2013   Eustachian tube dysfunction 07/20/2013   Malaise and fatigue 07/20/2013   Type II diabetes mellitus (HCC) 07/20/2013   Thoracic back sprain 06/04/2012   Endometriosis 02/01/2011   Anxiety 02/01/2011   Depression, unspecified 02/01/2011   Generalized anxiety disorder 02/01/2011   Insomnia 02/01/2011   Other seasonal allergic rhinitis 02/01/2011   Type I diabetes mellitus (HCC) 05/15/2007   Hypothyroidism 05/14/2005   Migraine, unspecified, not intractable, without status migrainosus 05/14/1998    Past Surgical History:  Procedure Laterality Date   ABDOMINAL HYSTERECTOMY     LAPAROSCOPY ABDOMEN DIAGNOSTIC     TONSILLECTOMY      OB History   No obstetric history on file.      Home Medications    Prior to Admission medications   Medication Sig Start Date End Date Taking? Authorizing Provider  amoxicillin-clavulanate (AUGMENTIN) 875-125 MG tablet Take 1 tablet by mouth every 12 (twelve) hours. 04/16/23  Yes Tomi Bamberger, PA-C  [facility] 5 MG/0.5ML Pen Inject 0.5 mg into the skin once a week.   Yes [provider]  promethazine-dextromethorphan (PROMETHAZINE-DM) 6.25-15 MG/5ML syrup Take 5 mLs by mouth 4 (four) times daily as needed for cough. 04/16/23  Yes Tomi Bamberger, PA-C  trimethoprim-polymyxin b (POLYTRIM) ophthalmic solution Place 1 drop into both eyes every 4 (four) hours for  7 days. 04/16/23 04/23/23 Yes Tomi Bamberger, PA-C  albuterol (ACCUNEB) 0.63 MG/3ML nebulizer solution Take 1 ampule by nebulization every 6 (six) hours as needed for wheezing or shortness of breath. 07/04/21   [provider]  albuterol (VENTOLIN HFA) 108 (90 Base) MCG/ACT inhaler Inhale 2 puffs into the lungs every 4 (four) hours as needed for wheezing or shortness of breath. 02/17/16   [provider]  ALPRAZolam Prudy Feeler) 1 MG tablet Take 1 mg  by mouth at bedtime as needed for sleep.    [provider]  amphetamine-dextroamphetamine (ADDERALL) 30 MG tablet Take 1 tablet by mouth 2 (two) times daily. 09/02/21   [provider]  ARIPiprazole (ABILIFY) 10 MG tablet Take 10 mg by mouth at bedtime.    [provider]  atorvastatin (LIPITOR) 80 MG tablet Take 80 mg by mouth at bedtime. 07/31/21   [provider]  baclofen (LIORESAL) 10 MG tablet Take 10 mg by mouth daily at 6 (six) AM.    [provider]  BAQSIMI TWO PACK 3 MG/DOSE POWD Place into both nostrils.    [provider]  butalbital-acetaminophen-caffeine (FIORICET) 50-325-40 MG tablet Take 2 tablets by mouth 2 (two) times daily. 03/09/13   [provider]  carbamazepine (TEGRETOL) 100 MG chewable tablet Chew 100 mg by mouth 3 (three) times daily. 02/17/16   [provider]  Cetirizine HCl 10 MG CAPS Take 1 capsule by mouth daily. 12/11/21   [provider]  Cholecalciferol 125 MCG (5000 UT) TABS Take 5,000 Units by mouth as directed. 02/01/11   [provider]  Cholecalciferol 50 MCG (2000 UT) TABS Take 2 tablets by mouth daily. 12/11/21   [provider]  Continuous Glucose Sensor (DEXCOM G6 SENSOR) MISC Apply topically as directed.    [provider]  Continuous Glucose Transmitter (DEXCOM G6 TRANSMITTER) MISC See admin instructions.    [provider]  cyanocobalamin (VITAMIN B12) 1000 MCG tablet Take 1,000 mcg by mouth daily. 12/11/21   [provider]  cyclobenzaprine (FLEXERIL) 10 MG tablet Take 1 tablet (10 mg total) by mouth 2 (two) times daily as needed for muscle spasms. 09/25/22   Piontek, Denny Peon, MD  fluconazole (DIFLUCAN) 150 MG tablet Take 150 mg by mouth once.    [provider]  fluticasone (FLONASE) 50 MCG/ACT nasal spray Place 1 spray into both nostrils daily. 02/05/23   Tomi Bamberger, PA-C  gabapentin (NEURONTIN) 100 MG capsule Take 100 mg  by mouth at bedtime.    [provider]  Glucagon HCl (GLUCAGON EMERGENCY) 1 MG/ML SOLR Inject 1 mg into the skin as needed. 02/06/23   [provider]  HUMALOG KWIKPEN 200 UNIT/ML KwikPen Inject into the skin. Pump in left arm--Humalog 200 units, every 3 days. 08/24/21   [provider]  ibuprofen (ADVIL) 800 MG tablet Take 800 mg by mouth every 8 (eight) hours as needed for fever, headache, mild pain (pain score 1-3), moderate pain (pain score 4-6) or cramping. 03/09/23   [provider]  Insulin Disposable Pump (OMNIPOD 5 DEXG7G6 PODS GEN 5) MISC Inject 1 each into the skin as directed.  Place 1 system onto the skin every 2 days. 03/11/23   [provider]  insulin glargine (LANTUS) 100 UNIT/ML injection Inject 0.25 mLs into the skin at bedtime. 02/17/16   [provider]  insulin glargine, 2 Unit Dial, (TOUJEO MAX SOLOSTAR) 300 UNIT/ML Solostar Pen Inject 80 Units into the skin daily at 6 (six)  AM.    [provider]  Insulin Human (INSULIN PUMP) SOLN Inject into the skin. Pump in left arm--Humalog 200 units    [provider]  Insulin Infusion Pump (MINIMED INSULIN PUMP) DEVI daily. 04/13/13   [provider]  Insulin Pen Needle (B-D UF III MINI PEN NEEDLES) 31G X 5 MM MISC by Does not apply route. 04/11/21   [provider]  levothyroxine (SYNTHROID) 150 MCG tablet Take 150 mcg by mouth daily before breakfast.    [provider]  linaclotide (LINZESS) 72 MCG capsule Take 72 mcg by mouth daily before breakfast. 06/27/22   [provider]  meloxicam (MOBIC) 15 MG tablet Take 15 mg by mouth daily. 09/01/20   [provider]  metoprolol succinate (TOPROL-XL) 50 MG 24 hr tablet Take 25 mg by mouth daily. 08/26/14   [provider]  nystatin (MYCOSTATIN) 100000 UNIT/ML suspension Take 5 mLs by mouth 4 (four) times daily.    [provider]  OZEMPIC, 0.25 OR 0.5 MG/DOSE, 2  MG/3ML SOPN Inject 0.5 mg into the skin once a week. 04/10/23   [provider]  pantoprazole (PROTONIX) 40 MG tablet Take 40 mg by mouth daily. 05/11/20   [provider]  predniSONE (DELTASONE) 10 MG tablet Take 10 mg by mouth as directed.    [provider]  QUVIVIQ 50 MG TABS Take 1 tablet by mouth at bedtime as needed (sleep). 08/15/21   [provider]  ropivacaine, PF, 5 mg/mL, 0.5%, (NAROPIN) 5 MG/ML injection Inject 4 mLs into the articular space once. 08/04/20   [provider]  Semaglutide, 1 MG/DOSE, (OZEMPIC, 1 MG/DOSE,) 4 MG/3ML SOPN Inject 1 mg into the skin once a week.    [provider]  tiZANidine (ZANAFLEX) 4 MG tablet Take 4 mg by mouth as directed. 08/31/14   [provider]  triamcinolone acetonide (KENALOG-40) 40 MG/ML injection Inject 80 mg into the articular space once. 08/04/20   [provider]  venlafaxine XR (EFFEXOR-XR) 150 MG 24 hr capsule Take 1 capsule by mouth daily with breakfast. 02/17/16   [provider]  venlafaxine XR (EFFEXOR-XR) 75 MG 24 hr capsule Take 75 mg by mouth daily with breakfast.    [provider]  sertraline (ZOLOFT) 100 MG tablet Take 2 tablets (200 mg total) by mouth daily. Patient not taking: Reported on 07/23/2014 06/17/12 03/03/20  Sherren Mocha, MD    Family History Family History  Problem Relation Age of Onset   Cancer Mother    Heart disease Father    Hypertension Father    Diabetes Father     Social History Social History   Tobacco Use   Smoking status: Every Day    Current packs/day: 1.00    Average packs/day: 1 pack/day for 20.0 years (20.0 ttl pk-yrs)    Types: Cigarettes   Smokeless tobacco: Never  Vaping Use   Vaping status: Never Used  Substance Use Topics   Alcohol use: Yes    Comment: Occassionally.   Drug use: No     Allergies   Eszopiclone, Other, Sumatriptan, Triptans, and Zolpidem   Review of Systems Review of Systems   Constitutional:  Negative for chills and fever.  HENT:  Positive for congestion, ear pain, sinus pressure and sore throat.   Eyes:  Negative for discharge and redness.  Respiratory:  Positive for cough. Negative for shortness of breath and wheezing.   Gastrointestinal:  Negative for abdominal pain, diarrhea, nausea and  vomiting.     Physical Exam Triage Vital Signs ED Triage Vitals  Encounter Vitals Group     BP      Systolic BP Percentile      Diastolic BP Percentile      Pulse      Resp      Temp      Temp src      SpO2      Weight      Height      Head Circumference      Peak Flow      Pain Score      Pain Loc      Pain Education      Exclude from Growth Chart    No data found.  Updated Vital Signs BP (!) 145/89 (BP Location: Left Arm) Comment: To recheck later during or after visit.  Pulse 88   Temp 98.7 F (37.1 C) (Oral)   Resp (!) 22   Ht 5' (1.524 m)   Wt 220 lb (99.8 kg)   SpO2 95%   BMI 42.97 kg/m      Physical Exam Vitals and nursing note reviewed.  Constitutional:      General: She is not in acute distress.    Appearance: Normal appearance. She is not ill-appearing.  HENT:     Head: Normocephalic and atraumatic.     Right Ear: Tympanic membrane normal.     Left Ear: Tympanic membrane normal.     Nose: Congestion present.     Mouth/Throat:     Mouth: Mucous membranes are moist.     Pharynx: No oropharyngeal exudate or posterior oropharyngeal erythema.  Eyes:     Conjunctiva/sclera: Conjunctivae normal.  Cardiovascular:     Rate and Rhythm: Normal rate and regular rhythm.     Heart sounds: Normal heart sounds. No murmur heard. Pulmonary:     Effort: Pulmonary effort is normal. No respiratory distress.     Breath sounds: Normal breath sounds. No wheezing, rhonchi or rales.  Skin:    General: Skin is warm and dry.  Neurological:     Mental Status: She is alert.  Psychiatric:        Mood and Affect: Mood normal.        Thought Content:  Thought content normal.      UC Treatments / Results  Labs (all labs ordered are listed, but only abnormal results are displayed) Labs Reviewed - No data to display  EKG   Radiology No results found.  Procedures Procedures (including critical care time)  Medications Ordered in UC Medications - No data to display  Initial Impression / Assessment and Plan / UC Course  I have reviewed the triage vital signs and the nursing notes.  Pertinent labs & imaging results that were available during my care of the patient were reviewed by me and considered in my medical decision making (see chart for details).    Will treat to cover sinusitis with Augmentin.  Will also treat to cover conjunctivitis with Polytrim drops.  Cough syrup prescribed at patient's request.  Encouraged follow-up if no gradual improvement with any worsening symptoms.  Final Clinical Impressions(s) / UC Diagnoses   Final diagnoses:  Acute sinusitis, recurrence not specified, unspecified location  Acute conjunctivitis of both eyes, unspecified acute conjunctivitis type   Discharge Instructions   None    ED Prescriptions     Medication Sig Dispense Auth. Provider   amoxicillin-clavulanate (AUGMENTIN) 875-125 MG tablet Take 1  tablet by mouth every 12 (twelve) hours. 14 tablet Erma Pinto F, PA-C   trimethoprim-polymyxin b (POLYTRIM) ophthalmic solution Place 1 drop into both eyes every 4 (four) hours for 7 days. 10 mL Tomi Bamberger, PA-C   promethazine-dextromethorphan (PROMETHAZINE-DM) 6.25-15 MG/5ML syrup Take 5 mLs by mouth 4 (four) times daily as needed for cough. 118 mL Tomi Bamberger, PA-C      PDMP not reviewed this encounter.   Tomi Bamberger, PA-C 04/16/23 1344

## 2023-04-16 NOTE — ED Triage Notes (Signed)
"  I started with a sore throat Wednesday evening, now congestion, sinus pressure/pain and ear pain, now having eye problems (pink/red eyes with discharge) this morning". No fever.

## 2024-02-26 ENCOUNTER — Ambulatory Visit: Admission: EM | Admit: 2024-02-26 | Discharge: 2024-02-26 | Disposition: A | Discharge status: Home / Self Care

## 2024-02-26 ENCOUNTER — Ambulatory Visit

## 2024-02-26 DIAGNOSIS — R0602 Shortness of breath: Secondary | ICD-10-CM | POA: Diagnosis not present

## 2024-02-26 DIAGNOSIS — J209 Acute bronchitis, unspecified: Secondary | ICD-10-CM | POA: Diagnosis not present

## 2024-02-26 MED ORDER — BENZONATATE 100 MG PO CAPS
100.0000 mg | ORAL_CAPSULE | Freq: Three times a day (TID) | ORAL | 0 refills | Status: DC
Start: 2024-02-26 — End: 2024-03-05

## 2024-02-26 MED ORDER — PREDNISONE 50 MG PO TABS: ORAL_TABLET | ORAL | 0 refills | Status: AC

## 2024-02-26 MED ORDER — GUAIFENESIN ER 600 MG PO TB12: 600.0000 mg | ORAL_TABLET | Freq: Two times a day (BID) | ORAL | 0 refills | Status: AC

## 2024-02-26 NOTE — ED Provider Notes (Signed)
 [name]    [name]: [id number] Arrival date & time: 02/26/24  1252      History   Chief Complaint Chief Complaint  Patient presents with   Cough    HPI Denise Collins is a 43 y.o. female.   Pt presents today due to cough productive of thick sputum for the past 1.5 weeks with subjective fever, chills, shortness of breath, dizziness, fatigue, and loss of appetite. Pt has a hx of bronchitis, states she used breathing treatment and OTC cold meds for symptoms with no relief of symptoms.    Cough   Past Medical History:  Diagnosis Date   Allergy    Anemia    Anxiety    Asthma    Depression    Diabetes mellitus without complication (HCC)    Migraine    Thyroid disease     Patient Active Problem List   Diagnosis Date Noted   Iron deficiency 12/18/2021   Seasonal allergies 12/11/2021   Vitamin B12 deficiency 11/30/2021   Carpal tunnel syndrome, bilateral 08/02/2021   Background diabetic retinopathy (HCC) 07/20/2019   Chronic fatigue 02/10/2019   Weight gain 02/10/2019   Smoker 08/13/2017   Essential hypertension 03/28/2017   Excessive daytime sleepiness 03/28/2017   Major depressive disorder, recurrent episode, moderate (HCC) 03/28/2017   Snoring 03/28/2017   Suicidal ideations 02/15/2016   Morbid obesity with BMI of 40.0-44.9, adult (HCC) 12/08/2015   Chest pain syndrome 09/07/2014   Palpitations 08/31/2014   Ventricular ectopy 08/31/2014   Ventricular premature depolarization 08/31/2014   Adiposity 07/19/2014   Hyperlipidemia 07/19/2014   Vitamin D deficiency 07/19/2014   Absence of both cervix and uterus, acquired 06/24/2014   History of hysterectomy 06/24/2014   Mental disorder 02/10/2014   Acute upper respiratory infection 07/20/2013   Eustachian tube dysfunction 07/20/2013   Malaise and fatigue 07/20/2013   Type II diabetes mellitus (HCC) 07/20/2013   Thoracic back sprain 06/04/2012   Endometriosis 02/01/2011   Anxiety 02/01/2011    Depression, unspecified 02/01/2011   Generalized anxiety disorder 02/01/2011   Insomnia 02/01/2011   Other seasonal allergic rhinitis 02/01/2011   Type I diabetes mellitus (HCC) 05/15/2007   Hypothyroidism 05/14/2005   Migraine, unspecified, not intractable, without status migrainosus 05/14/1998    Past Surgical History:  Procedure Laterality Date   ABDOMINAL HYSTERECTOMY     LAPAROSCOPY ABDOMEN DIAGNOSTIC     TONSILLECTOMY      OB History   No obstetric history on file.      Home Medications    Prior to Admission medications   Medication Sig Start Date End Date Taking? Authorizing Provider  benzonatate (TESSALON) 100 MG capsule Take 1 capsule (100 mg total) by mouth every 8 (eight) hours. 02/26/24  Yes [name] Corean BROCKS, PA-C  guaiFENesin  (MUCINEX ) 600 MG 12 hr tablet Take 1 tablet (600 mg total) by mouth 2 (two) times daily for 10 days. 02/26/24 03/07/24 Yes [name] Corean C, PA-C  predniSONE  (DELTASONE ) 50 MG tablet 1 tab po daily for 5 days 02/26/24  Yes [name] Corean BROCKS, PA-C  albuterol  (ACCUNEB ) 0.63 MG/3ML nebulizer solution Take 1 ampule by nebulization every 6 (six) hours as needed for wheezing or shortness of breath. 07/04/21   [provider]  albuterol  (VENTOLIN  HFA) 108 (90 Base) MCG/ACT inhaler Inhale 2 puffs into the lungs every 4 (four) hours as needed for wheezing or shortness of breath. 02/17/16   [provider]  ALPRAZolam (XANAX) 1 MG tablet Take 1 mg by mouth  at bedtime as needed for sleep.    [provider]  amoxicillin -clavulanate (AUGMENTIN ) 875-125 MG tablet Take 1 tablet by mouth every 12 (twelve) hours. 04/16/23   Billy Asberry FALCON, PA-C  amphetamine-dextroamphetamine (ADDERALL) 30 MG tablet Take 1 tablet by mouth 2 (two) times daily. 09/02/21   [provider]  ARIPiprazole (ABILIFY) 10 MG tablet Take 10 mg by mouth at bedtime.    [provider]  atorvastatin (LIPITOR) 80 MG tablet Take 80 mg by  mouth at bedtime. 07/31/21   [provider]  baclofen (LIORESAL) 10 MG tablet Take 10 mg by mouth daily at 6 (six) AM.    [provider]  BAQSIMI TWO PACK 3 MG/DOSE POWD Place into both nostrils.    [provider]  butalbital-acetaminophen -caffeine (FIORICET) 50-325-40 MG tablet Take 2 tablets by mouth 2 (two) times daily. 03/09/13   [provider]  carbamazepine (TEGRETOL) 100 MG chewable tablet Chew 100 mg by mouth 3 (three) times daily. 02/17/16   [provider]  Cetirizine  HCl 10 MG CAPS Take 1 capsule by mouth daily. 12/11/21   [provider]  Cholecalciferol 125 MCG (5000 UT) TABS Take 5,000 Units by mouth as directed. 02/01/11   [provider]  Cholecalciferol 50 MCG (2000 UT) TABS Take 2 tablets by mouth daily. 12/11/21   [provider]  Continuous Glucose Sensor (DEXCOM G6 SENSOR) MISC Apply topically as directed.    [provider]  Continuous Glucose Transmitter (DEXCOM G6 TRANSMITTER) MISC See admin instructions.    [provider]  cyanocobalamin (VITAMIN B12) 1000 MCG tablet Take 1,000 mcg by mouth daily. 12/11/21   [provider]  cyclobenzaprine  (FLEXERIL ) 10 MG tablet Take 1 tablet (10 mg total) by mouth 2 (two) times daily as needed for muscle spasms. 09/25/22   Piontek, Rocky, MD  fluconazole  (DIFLUCAN ) 150 MG tablet Take 150 mg by mouth once.    [provider]  fluticasone  (FLONASE ) 50 MCG/ACT nasal spray Place 1 spray into both nostrils daily. 02/05/23   Billy Asberry FALCON, PA-C  gabapentin (NEURONTIN) 100 MG capsule Take 100 mg by mouth at bedtime.    [provider]  Glucagon HCl (GLUCAGON EMERGENCY) 1 MG/ML SOLR Inject 1 mg into the skin as needed. 02/06/23   [provider]  HUMALOG KWIKPEN 200 UNIT/ML KwikPen Inject into the skin. Pump in left arm--Humalog 200 units, every 3 days. 08/24/21   [provider]  ibuprofen  (ADVIL ) 800 MG tablet Take  800 mg by mouth every 8 (eight) hours as needed for fever, headache, mild pain (pain score 1-3), moderate pain (pain score 4-6) or cramping. 03/09/23   [provider]  Insulin  Disposable Pump (OMNIPOD 5 DEXG7G6 PODS GEN 5) MISC Inject 1 each into the skin as directed.  Place 1 system onto the skin every 2 days. 03/11/23   [provider]  insulin  glargine (LANTUS) 100 UNIT/ML injection Inject 0.25 mLs into the skin at bedtime. 02/17/16   [provider]  insulin  glargine, 2 Unit Dial, (TOUJEO MAX SOLOSTAR) 300 UNIT/ML Solostar Pen Inject 80 Units into the skin daily at 6 (six) AM.    [provider]  Insulin  Human (INSULIN  PUMP) SOLN Inject into the skin. Pump in left arm--Humalog 200 units    [provider]  Insulin  Infusion Pump (MINIMED INSULIN  PUMP) DEVI daily. 04/13/13   [provider]  Insulin  Pen Needle (B-D UF III MINI PEN NEEDLES) 31G X 5 MM MISC by Does  not apply route. 04/11/21   [provider]  levothyroxine (SYNTHROID) 150 MCG tablet Take 150 mcg by mouth daily before breakfast.    [provider]  linaclotide (LINZESS) 72 MCG capsule Take 72 mcg by mouth daily before breakfast. 06/27/22   [provider]  meloxicam  (MOBIC ) 15 MG tablet Take 15 mg by mouth daily. 09/01/20   [provider]  metoprolol succinate (TOPROL-XL) 50 MG 24 hr tablet Take 25 mg by mouth daily. 08/26/14   [provider]  MOUNJARO 5 MG/0.5ML Pen Inject 0.5 mg into the skin once a week.    [provider]  nystatin (MYCOSTATIN) 100000 UNIT/ML suspension Take 5 mLs by mouth 4 (four) times daily.    [provider]  OZEMPIC, 0.25 OR 0.5 MG/DOSE, 2 MG/3ML SOPN Inject 0.5 mg into the skin once a week. 04/10/23   [provider]  pantoprazole (PROTONIX) 40 MG tablet Take 40 mg by mouth daily. 05/11/20   [provider]  promethazine -dextromethorphan (PROMETHAZINE -DM) 6.25-15 MG/5ML syrup  Take 5 mLs by mouth 4 (four) times daily as needed for cough. 04/16/23   Billy Asberry FALCON, PA-C  QUVIVIQ 50 MG TABS Take 1 tablet by mouth at bedtime as needed (sleep). 08/15/21   [provider]  ropivacaine, PF, 5 mg/mL, 0.5%, (NAROPIN) 5 MG/ML injection Inject 4 mLs into the articular space once. 08/04/20   [provider]  Semaglutide, 1 MG/DOSE, (OZEMPIC, 1 MG/DOSE,) 4 MG/3ML SOPN Inject 1 mg into the skin once a week.    [provider]  tiZANidine (ZANAFLEX) 4 MG tablet Take 4 mg by mouth as directed. 08/31/14   [provider]  triamcinolone acetonide (KENALOG-40) 40 MG/ML injection Inject 80 mg into the articular space once. 08/04/20   [provider]  venlafaxine XR (EFFEXOR-XR) 150 MG 24 hr capsule Take 1 capsule by mouth daily with breakfast. 02/17/16   [provider]  venlafaxine XR (EFFEXOR-XR) 75 MG 24 hr capsule Take 75 mg by mouth daily with breakfast.    [provider]  sertraline  (ZOLOFT ) 100 MG tablet Take 2 tablets (200 mg total) by mouth daily. Patient not taking: Reported on 07/23/2014 06/17/12 03/03/20  Loreli Elyn SAILOR, MD    Family History Family History  Problem Relation Age of Onset   Cancer Mother    Heart disease Father    Hypertension Father    Diabetes Father     Social History Social History   Tobacco Use   Smoking status: Every Day    Current packs/day: 1.00    Average packs/day: 1 pack/day for 20.0 years (20.0 ttl pk-yrs)    Types: Cigarettes   Smokeless tobacco: Never  Vaping Use   Vaping status: Never Used  Substance Use Topics   Alcohol use: Yes    Comment: Occassionally.   Drug use: No     Allergies   Eszopiclone, Other, Sumatriptan, Triptans, and Zolpidem   Review of Systems Review of Systems  Respiratory:  Positive for cough.      Physical Exam Triage Vital Signs ED Triage Vitals  Encounter Vitals Group     BP 02/26/24 1301 134/85     Girls Systolic BP Percentile --       Girls Diastolic BP Percentile --      Boys Systolic BP Percentile --      Boys Diastolic BP Percentile --      Pulse Rate 02/26/24 1301 85     Resp 02/26/24 1301 18  Temp 02/26/24 1301 97.6 F (36.4 C)     Temp Source 02/26/24 1301 Oral     SpO2 02/26/24 1301 97 %     Weight --      Height --      Head Circumference --      Peak Flow --      Pain Score 02/26/24 1259 4     Pain Loc --      Pain Education --      Exclude from Growth Chart --    No data found.  Updated Vital Signs BP 134/85 (BP Location: Left Arm)   Pulse 85   Temp 97.6 F (36.4 C) (Oral)   Resp 18   SpO2 97%   Visual Acuity Right Eye Distance:   Left Eye Distance:   Bilateral Distance:    Right Eye Near:   Left Eye Near:    Bilateral Near:     Physical Exam Vitals and nursing note reviewed.  Constitutional:      General: She is not in acute distress.    Appearance: Normal appearance. She is not ill-appearing, toxic-appearing or diaphoretic.  Eyes:     General: No scleral icterus. Cardiovascular:     Rate and Rhythm: Normal rate and regular rhythm.     Heart sounds: Normal heart sounds.  Pulmonary:     Breath sounds: Decreased air movement present. No wheezing or rhonchi.  Skin:    General: Skin is warm.  Neurological:     Mental Status: She is alert and oriented to person, place, and time.  Psychiatric:        Mood and Affect: Mood normal.        Behavior: Behavior normal.      UC Treatments / Results  Labs (all labs ordered are listed, but only abnormal results are displayed) Labs Reviewed - No data to display  EKG   Radiology DG Chest 2 View Result Date: 02/26/2024 CLINICAL DATA:  Cough and chest congestion. EXAM: CHEST - 2 VIEW COMPARISON:  Sep 25, 2022 FINDINGS: The heart size and mediastinal contours are within normal limits. Both lungs are clear. The visualized skeletal structures are unremarkable. IMPRESSION: No active cardiopulmonary disease. Electronically Signed   By:  Suzen Dials M.D.   On: 02/26/2024 13:40    Procedures Procedures (including critical care time)  Medications Ordered in UC Medications - No data to display  Initial Impression / Assessment and Plan / UC Course  I have reviewed the triage vital signs and the nursing notes.  Pertinent labs & imaging results that were available during my care of the patient were reviewed by me and considered in my medical decision making (see chart for details).     Acute bronchitis- prescribed tessalon 100 mg TID, mucinex  600 mg BID, and prednisone  50 mg daily for 5 days.  Final Clinical Impressions(s) / UC Diagnoses   Final diagnoses:  Shortness of breath  Acute bronchitis, unspecified organism   Discharge Instructions   None    ED Prescriptions     Medication Sig Dispense Auth. Provider   benzonatate (TESSALON) 100 MG capsule Take 1 capsule (100 mg total) by mouth every 8 (eight) hours. 21 capsule [name] Krabbe C, PA-C   guaiFENesin  (MUCINEX ) 600 MG 12 hr tablet Take 1 tablet (600 mg total) by mouth 2 (two) times daily for 10 days. 20 tablet Keyuana Wank C, PA-C   predniSONE  (DELTASONE ) 50 MG tablet 1 tab po daily for 5 days 5 tablet  [name] Corean BROCKS, PA-C      PDMP not reviewed this encounter.   [name] Corean BROCKS, PA-C 02/26/24 1408

## 2024-02-26 NOTE — ED Triage Notes (Signed)
 Pt present cough with congestion in her head and chest, symptoms started last Monday, pt has been taking OTC medication with no relief.

## 2024-03-05 ENCOUNTER — Other Ambulatory Visit: Payer: Self-pay

## 2024-03-05 ENCOUNTER — Encounter: Payer: Self-pay | Admitting: *Deleted

## 2024-03-05 ENCOUNTER — Ambulatory Visit
Admission: EM | Admit: 2024-03-05 | Discharge: 2024-03-05 | Disposition: A | Discharge status: Home / Self Care | Attending: Emergency Medicine | Admitting: Emergency Medicine

## 2024-03-05 DIAGNOSIS — J019 Acute sinusitis, unspecified: Secondary | ICD-10-CM | POA: Diagnosis not present

## 2024-03-05 DIAGNOSIS — B9689 Other specified bacterial agents as the cause of diseases classified elsewhere: Secondary | ICD-10-CM

## 2024-03-05 MED ORDER — PROMETHAZINE-DM 6.25-15 MG/5ML PO SYRP: 5.0000 mL | ORAL_SOLUTION | Freq: Four times a day (QID) | ORAL | 0 refills | Status: AC | PRN

## 2024-03-05 MED ORDER — AMOXICILLIN-POT CLAVULANATE 875-125 MG PO TABS: 1.0000 | ORAL_TABLET | Freq: Two times a day (BID) | ORAL | 0 refills | Status: AC

## 2024-03-05 NOTE — ED Triage Notes (Signed)
 Pt reports she was seen here on 10/15 and dx with bronchitis. States she is not feeling any better, although she did feel better while she was taking the steroids. States she is still coughing, has headache, feels fatigued, and ears feel full.  Taking prescribed medications that she has left

## 2024-03-05 NOTE — Discharge Instructions (Signed)
 Please take medication Augmentin  as prescribed. Take with food to avoid upset stomach. Finish the full course - you should not have any leftover!  The promethazine  DM cough syrup can be used up to 4 times daily. If this medication makes you drowsy, take only once before bed.

## 2024-03-05 NOTE — ED Provider Notes (Signed)
 [name]    [name]: [id number] Arrival date & time: 03/05/24  1342     History   Chief Complaint Chief Complaint  Patient presents with   Cough    HPI Denise Collins is a 43 y.o. female.  Here with persisting nasal congestion.  Reports worsening over the last week, increased sinus pressure, pain in the ears and muffled hearing.  She does still have cough but this has not worsened.  Seen 8 days ago on 10/15, given Tessalon guaifenesin  prednisone  Had negative chest x-ray Lung symptoms overall improving, the sinus pressure and congestion is what has worsened.  No recent fevers  Past Medical History:  Diagnosis Date   Allergy    Anemia    Anxiety    Asthma    Depression    Diabetes mellitus without complication (HCC)    Migraine    Thyroid disease     Patient Active Problem List   Diagnosis Date Noted   Iron deficiency 12/18/2021   Seasonal allergies 12/11/2021   Vitamin B12 deficiency 11/30/2021   Carpal tunnel syndrome, bilateral 08/02/2021   Background diabetic retinopathy (HCC) 07/20/2019   Chronic fatigue 02/10/2019   Weight gain 02/10/2019   Smoker 08/13/2017   Essential hypertension 03/28/2017   Excessive daytime sleepiness 03/28/2017   Major depressive disorder, recurrent episode, moderate (HCC) 03/28/2017   Snoring 03/28/2017   Suicidal ideations 02/15/2016   Morbid obesity with BMI of 40.0-44.9, adult (HCC) 12/08/2015   Chest pain syndrome 09/07/2014   Palpitations 08/31/2014   Ventricular ectopy 08/31/2014   Ventricular premature depolarization 08/31/2014   Adiposity 07/19/2014   Hyperlipidemia 07/19/2014   Vitamin D deficiency 07/19/2014   Absence of both cervix and uterus, acquired 06/24/2014   History of hysterectomy 06/24/2014   Mental disorder 02/10/2014   Acute upper respiratory infection 07/20/2013   Eustachian tube dysfunction 07/20/2013   Malaise and fatigue 07/20/2013   Type II diabetes mellitus (HCC) 07/20/2013    Thoracic back sprain 06/04/2012   Endometriosis 02/01/2011   Anxiety 02/01/2011   Depression, unspecified 02/01/2011   Generalized anxiety disorder 02/01/2011   Insomnia 02/01/2011   Other seasonal allergic rhinitis 02/01/2011   Type I diabetes mellitus (HCC) 05/15/2007   Hypothyroidism 05/14/2005   Migraine, unspecified, not intractable, without status migrainosus 05/14/1998    Past Surgical History:  Procedure Laterality Date   ABDOMINAL HYSTERECTOMY     LAPAROSCOPY ABDOMEN DIAGNOSTIC     TONSILLECTOMY      OB History   No obstetric history on file.      Home Medications    Prior to Admission medications   Medication Sig Start Date End Date Taking? Authorizing Provider  ALPRAZolam (XANAX) 1 MG tablet Take 1 mg by mouth at bedtime as needed for sleep.   Yes [provider]  amoxicillin -clavulanate (AUGMENTIN ) 875-125 MG tablet Take 1 tablet by mouth every 12 (twelve) hours for 7 days. 03/05/24 03/12/24 Yes Katherleen Folkes, Asberry, PA-C  amphetamine-dextroamphetamine (ADDERALL) 30 MG tablet Take 1 tablet by mouth 2 (two) times daily. 09/02/21  Yes [provider]  atorvastatin (LIPITOR) 80 MG tablet Take 80 mg by mouth at bedtime. 07/31/21  Yes [provider]  baclofen (LIORESAL) 10 MG tablet Take 10 mg by mouth daily at 6 (six) AM.   Yes [provider]  Cetirizine  HCl 10 MG CAPS Take 1 capsule by mouth daily. 12/11/21  Yes [provider]  Cholecalciferol 125 MCG (5000 UT) TABS Take 5,000 Units by mouth as directed.  02/01/11  Yes [provider]  cyanocobalamin (VITAMIN B12) 1000 MCG tablet Take 1,000 mcg by mouth daily. 12/11/21  Yes [provider]  guaiFENesin  (MUCINEX ) 600 MG 12 hr tablet Take 1 tablet (600 mg total) by mouth 2 (two) times daily for 10 days. 02/26/24 03/07/24 Yes [name], Stephanie C, PA-C  HUMALOG KWIKPEN 200 UNIT/ML KwikPen Inject into the skin. Pump in left arm--Humalog 200 units, every 3 days. 08/24/21   Yes [provider]  levothyroxine (SYNTHROID) 150 MCG tablet Take 150 mcg by mouth daily before breakfast.   Yes [provider]  pantoprazole (PROTONIX) 40 MG tablet Take 40 mg by mouth daily. 05/11/20  Yes [provider]  promethazine -dextromethorphan (PROMETHAZINE -DM) 6.25-15 MG/5ML syrup Take 5 mLs by mouth 4 (four) times daily as needed for cough. 03/05/24  Yes Ashika Apuzzo, Asberry, PA-C  albuterol  (ACCUNEB ) 0.63 MG/3ML nebulizer solution Take 1 ampule by nebulization every 6 (six) hours as needed for wheezing or shortness of breath. 07/04/21   [provider]  BAQSIMI TWO PACK 3 MG/DOSE POWD Place into both nostrils.    [provider]  Cholecalciferol 50 MCG (2000 UT) TABS Take 2 tablets by mouth daily. 12/11/21   [provider]  Continuous Glucose Sensor (DEXCOM G6 SENSOR) MISC Apply topically as directed.    [provider]  Continuous Glucose Transmitter (DEXCOM G6 TRANSMITTER) MISC See admin instructions.    [provider]  Glucagon HCl (GLUCAGON EMERGENCY) 1 MG/ML SOLR Inject 1 mg into the skin as needed. 02/06/23   [provider]  Insulin  Disposable Pump (OMNIPOD 5 DEXG7G6 PODS GEN 5) MISC Inject 1 each into the skin as directed.  Place 1 system onto the skin every 2 days. 03/11/23   [provider]  Insulin  Human (INSULIN  PUMP) SOLN Inject into the skin. Pump in left arm--Humalog 200 units    [provider]  Insulin  Infusion Pump (MINIMED INSULIN  PUMP) DEVI daily. 04/13/13   [provider]  Insulin  Pen Needle (B-D UF III MINI PEN NEEDLES) 31G X 5 MM MISC by Does not apply route. 04/11/21   [provider]  predniSONE  (DELTASONE ) 50 MG tablet 1 tab po daily for 5 days Patient not taking: Reported on 03/05/2024 02/26/24   [name] Krabbe C, PA-C  ropivacaine, PF, 5 mg/mL, 0.5%, (NAROPIN) 5 MG/ML injection Inject 4 mLs into the articular space once. 08/04/20   [provider]  triamcinolone acetonide (KENALOG-40) 40 MG/ML injection Inject 80 mg into the articular space once. 08/04/20   [provider]  sertraline  (ZOLOFT ) 100 MG tablet Take 2 tablets (200 mg total) by mouth daily. Patient not taking: Reported on 07/23/2014 06/17/12 03/03/20  Loreli Elyn SAILOR, MD    Family History Family History  Problem Relation Age of Onset   Cancer Mother    Heart disease Father    Hypertension Father    Diabetes Father     Social History Social History   Tobacco Use   Smoking status: Every Day    Current packs/day: 1.00    Average packs/day: 1 pack/day for 20.0 years (20.0 ttl pk-yrs)    Types: Cigarettes   Smokeless tobacco: Never  Vaping Use   Vaping status: Never Used  Substance Use Topics   Alcohol use: Yes    Comment: Occassionally.   Drug use: No     Allergies   Eszopiclone, Other, Sumatriptan, Triptans, and Zolpidem   Review of Systems Review of Systems As per HPI  Physical Exam  Triage Vital Signs ED Triage Vitals  Encounter Vitals Group     BP 03/05/24 1433 (!) 142/86     Girls Systolic BP Percentile --      Girls Diastolic BP Percentile --      Boys Systolic BP Percentile --      Boys Diastolic BP Percentile --      Pulse Rate 03/05/24 1433 78     Resp 03/05/24 1433 18     Temp 03/05/24 1433 98 F (36.7 C)     Temp Source 03/05/24 1433 Oral     SpO2 03/05/24 1433 93 %     Weight --      Height --      Head Circumference --      Peak Flow --      Pain Score 03/05/24 1427 3     Pain Loc --      Pain Education --      Exclude from Growth Chart --    No data found.  Updated Vital Signs BP (!) 142/86 (BP Location: Left Arm)   Pulse 78   Temp 98 F (36.7 C) (Oral)   Resp 18   SpO2 93%   Physical Exam Vitals and nursing note reviewed.  Constitutional:      General: She is not in acute distress.    Appearance: Normal appearance.  HENT:     Right Ear: Tympanic membrane and ear canal normal.     Left Ear:  Tympanic membrane and ear canal normal.     Nose: Congestion present.     Right Sinus: Maxillary sinus tenderness and frontal sinus tenderness present.     Left Sinus: Maxillary sinus tenderness and frontal sinus tenderness present.     Mouth/Throat:     Pharynx: Oropharynx is clear.  Cardiovascular:     Rate and Rhythm: Normal rate and regular rhythm.     Heart sounds: Normal heart sounds.  Pulmonary:     Effort: Pulmonary effort is normal.     Breath sounds: Normal breath sounds.  Musculoskeletal:     Cervical back: Normal range of motion. No rigidity.  Lymphadenopathy:     Cervical: No cervical adenopathy.  Neurological:     Mental Status: She is alert and oriented to person, place, and time.     UC Treatments / Results  Labs (all labs ordered are listed, but only abnormal results are displayed) Labs Reviewed - No data to display  EKG  Radiology No results found.  Procedures Procedures  Medications Ordered in UC Medications - No data to display  Initial Impression / Assessment and Plan / UC Course  I have reviewed the triage vital signs and the nursing notes.  Pertinent labs & imaging results that were available during my care of the patient were reviewed by me and considered in my medical decision making (see chart for details).  Sinus congestion and tenderness on exam. Lungs clear throughout. Stable vitals. With increasing and worsening sinus congestion and pressure, treat for bacterial sinusitis with augmentin  BID x 7 days. Continue other supportive care. Cough is improving however I have still sent promethazine  DM to use QID prn or nightly if drowsy. Advised if not improving in 48 hours please return. Patient is agreeable with plan, no questions at this time   Final Clinical Impressions(s) / UC Diagnoses   Final diagnoses:  Acute bacterial sinusitis     Discharge Instructions      Please take medication Augmentin  as prescribed.  Take with food to avoid  upset stomach. Finish the full course - you should not have any leftover!  The promethazine  DM cough syrup can be used up to 4 times daily. If this medication makes you drowsy, take only once before bed.     ED Prescriptions     Medication Sig Dispense Auth. Provider   amoxicillin -clavulanate (AUGMENTIN ) 875-125 MG tablet Take 1 tablet by mouth every 12 (twelve) hours for 7 days. 14 tablet Tamarick Kovalcik, PA-C   promethazine -dextromethorphan (PROMETHAZINE -DM) 6.25-15 MG/5ML syrup Take 5 mLs by mouth 4 (four) times daily as needed for cough. 240 mL Lymon Kidney, Asberry, PA-C      PDMP not reviewed this encounter.   Jeryl Asberry, PA-C 03/05/24 [id number]
